# Patient Record
Sex: Male | Born: 1955
Health system: Southern US, Community
[De-identification: ages and names within clinical notes are randomized; demographics above are authoritative.]

## PROBLEM LIST (undated history)

## (undated) DIAGNOSIS — D369 Benign neoplasm, unspecified site: Secondary | ICD-10-CM

## (undated) DIAGNOSIS — A4902 Methicillin resistant Staphylococcus aureus infection, unspecified site: Secondary | ICD-10-CM

## (undated) DIAGNOSIS — H9191 Unspecified hearing loss, right ear: Secondary | ICD-10-CM

## (undated) DIAGNOSIS — I10 Essential (primary) hypertension: Secondary | ICD-10-CM

## (undated) DIAGNOSIS — H269 Unspecified cataract: Secondary | ICD-10-CM

## (undated) DIAGNOSIS — E119 Type 2 diabetes mellitus without complications: Secondary | ICD-10-CM

## (undated) DIAGNOSIS — I700 Atherosclerosis of aorta: Secondary | ICD-10-CM

## (undated) DIAGNOSIS — E785 Hyperlipidemia, unspecified: Secondary | ICD-10-CM

## (undated) HISTORY — PX: INCISE AND DRAIN ABCESS: PRO64

## (undated) HISTORY — DX: Hyperlipidemia, unspecified: E78.5

## (undated) HISTORY — DX: Essential (primary) hypertension: I10

## (undated) HISTORY — PX: OTHER SURGICAL HISTORY: SHX169

## (undated) HISTORY — PX: POLYPECTOMY: SHX149

## (undated) HISTORY — DX: Unspecified hearing loss, right ear: H91.91

## (undated) HISTORY — DX: Benign neoplasm, unspecified site: D36.9

## (undated) HISTORY — DX: Unspecified cataract: H26.9

## (undated) HISTORY — DX: Type 2 diabetes mellitus without complications: E11.9

## (undated) HISTORY — PX: CYST REMOVAL LEG: SHX6280

## (undated) HISTORY — PX: COLONOSCOPY: SHX174

## (undated) HISTORY — DX: Atherosclerosis of aorta: I70.0

## (undated) HISTORY — DX: Methicillin resistant Staphylococcus aureus infection, unspecified site: A49.02

## (undated) HISTORY — PX: DG THUMB LEFT HAND: HXRAD1658

---

## 2002-05-29 ENCOUNTER — Encounter: Admission: RE | Admit: 2002-05-29 | Discharge: 2002-08-27 | Payer: Self-pay | Admitting: Family Medicine

## 2002-09-20 ENCOUNTER — Encounter: Admission: RE | Admit: 2002-09-20 | Discharge: 2002-12-19 | Payer: Self-pay | Admitting: Family Medicine

## 2005-09-24 ENCOUNTER — Ambulatory Visit: Payer: Self-pay | Admitting: Gastroenterology

## 2005-10-07 ENCOUNTER — Ambulatory Visit: Payer: Self-pay | Admitting: Gastroenterology

## 2009-01-23 ENCOUNTER — Encounter: Admission: RE | Admit: 2009-01-23 | Discharge: 2009-01-23 | Payer: Self-pay | Admitting: Otolaryngology

## 2011-03-22 ENCOUNTER — Inpatient Hospital Stay (INDEPENDENT_AMBULATORY_CARE_PROVIDER_SITE_OTHER)
Admission: RE | Admit: 2011-03-22 | Discharge: 2011-03-22 | Disposition: A | Discharge status: Home / Self Care | Payer: BC Managed Care – PPO | Source: Ambulatory Visit | Attending: Family Medicine | Admitting: Family Medicine

## 2011-03-22 DIAGNOSIS — L02519 Cutaneous abscess of unspecified hand: Secondary | ICD-10-CM

## 2011-04-08 DIAGNOSIS — E1142 Type 2 diabetes mellitus with diabetic polyneuropathy: Secondary | ICD-10-CM | POA: Insufficient documentation

## 2015-10-17 ENCOUNTER — Encounter: Payer: Self-pay | Admitting: Gastroenterology

## 2015-10-29 ENCOUNTER — Encounter: Payer: Self-pay | Admitting: Gastroenterology

## 2015-12-02 ENCOUNTER — Ambulatory Visit (AMBULATORY_SURGERY_CENTER): Payer: Self-pay | Admitting: *Deleted

## 2015-12-02 VITALS — Ht 72.0 in | Wt 194.0 lb

## 2015-12-02 DIAGNOSIS — Z1211 Encounter for screening for malignant neoplasm of colon: Secondary | ICD-10-CM

## 2015-12-02 MED ORDER — NA SULFATE-K SULFATE-MG SULF 17.5-3.13-1.6 GM/177ML PO SOLN: ORAL | Status: DC

## 2015-12-02 NOTE — Progress Notes (Signed)
Patient denies any allergies to eggs or soy. Patient denies any problems with anesthesia/sedation. Patient denies any oxygen use at home and does not take any diet/weight loss medications. EMMI education assisgned to patient on colonoscopy, this was explained and instructions given to patient. 

## 2015-12-10 ENCOUNTER — Ambulatory Visit (AMBULATORY_SURGERY_CENTER): Payer: 59 | Admitting: Gastroenterology

## 2015-12-10 ENCOUNTER — Encounter: Payer: Self-pay | Admitting: Gastroenterology

## 2015-12-10 VITALS — BP 100/55 | HR 65 | Temp 97.8°F | Resp 18 | Ht 72.0 in | Wt 194.0 lb

## 2015-12-10 DIAGNOSIS — D122 Benign neoplasm of ascending colon: Secondary | ICD-10-CM

## 2015-12-10 DIAGNOSIS — Z1211 Encounter for screening for malignant neoplasm of colon: Secondary | ICD-10-CM | POA: Diagnosis not present

## 2015-12-10 MED ORDER — SODIUM CHLORIDE 0.9 % IV SOLN: 500.0000 mL | INTRAVENOUS | Status: DC

## 2015-12-10 NOTE — Progress Notes (Signed)
Called to room to assist during endoscopic procedure.  Patient ID and intended procedure confirmed with present staff. Received instructions for my participation in the procedure from the performing physician.  

## 2015-12-10 NOTE — Progress Notes (Signed)
A/ox3 pleased with MAC, report to Wendy RN 

## 2015-12-10 NOTE — Op Note (Signed)
[facility] Patient Name: Willie [name]: 12/10/2015 8:07 AM MRN: [id number] Endoscopist: Remo Lipps P. Havery Moros , MD Age: 60 [name]: 1956/03/05 Gender: Male Procedure:                Colonoscopy Indications:              Screening for malignant neoplasm in the colon Medicines:                Monitored Anesthesia Care Procedure:                Pre-Anesthesia Assessment:                           - Prior to the procedure, a History and Physical                            was performed, and patient medications and                            allergies were reviewed. The patient's tolerance of                            previous anesthesia was also reviewed. The risks                            and benefits of the procedure and the sedation                            options and risks were discussed with the patient.                            All questions were answered, and informed consent                            was obtained. Prior Anticoagulants: The patient has                            taken aspirin, last dose was 1 day prior to                            procedure. ASA Grade Assessment: II - A patient                            with mild systemic disease. After reviewing the                            risks and benefits, the patient was deemed in                            satisfactory condition to undergo the procedure.                           After obtaining informed consent, the colonoscope  was passed under direct vision. Throughout the                            procedure, the patient's blood pressure, pulse, and                            oxygen saturations were monitored continuously. The                            Model CF-HQ190L [id number]) scope was introduced                            through the anus and advanced to the the cecum,                            identified by appendiceal orifice and ileocecal                         valve. The colonoscopy was performed without                            difficulty. The patient tolerated the procedure                            well. The quality of the bowel preparation was                            adequate. The ileocecal valve, appendiceal orifice,                            and rectum were photographed. Scope In: 8:19:50 AM Scope Out: 8:36:04 AM Scope Withdrawal Time: 0 hours 13 minutes 32 seconds  Total Procedure Duration: 0 hours 16 minutes 14 seconds  Findings:                 The perianal and digital rectal examinations were                            normal.                           Two sessile polyps were found in the ascending                            colon. The polyps were 4 mm in size. These polyps                            were removed with a cold snare. Resection and                            retrieval were complete.                           A few medium-mouthed diverticula were found in the  left colon.                           The exam was otherwise without abnormality. Complications:            No immediate complications. Estimated blood loss:                            Minimal. Estimated Blood Loss:     Estimated blood loss was minimal. Impression:               - Two 4 mm polyps in the ascending colon, removed                            with a cold snare. Resected and retrieved.                           - Diverticulosis in the left colon.                           - The examination was otherwise normal. Recommendation:           - Patient has a contact number available for                            emergencies. The signs and symptoms of potential                            delayed complications were discussed with the                            patient. Return to normal activities tomorrow.                            Written discharge instructions were provided to the                             patient.                           - Resume previous diet.                           - Continue present medications.                           - No aspirin, ibuprofen, naproxen, or other                            non-steroidal anti-inflammatory drugs for 2 weeks                            after polyp removal.                           - Await pathology results.                           -  Repeat colonoscopy is recommended for                            surveillance. The colonoscopy [name] will be                            determined after pathology results from today's                            exam become available for review. Remo Lipps P. Armbruster, MD 12/10/2015 8:39:28 AM This report has been signed electronically.

## 2015-12-10 NOTE — Patient Instructions (Signed)
YOU HAD AN ENDOSCOPIC PROCEDURE TODAY AT THE [facility]:   Refer to the procedure report that was given to you for any specific questions about what was found during the examination.  If the procedure report does not answer your questions, please call your gastroenterologist to clarify.  If you requested that your care partner not be given the details of your procedure findings, then the procedure report has been included in a sealed envelope for you to review at your convenience later.  YOU SHOULD EXPECT: Some feelings of bloating in the abdomen. Passage of more gas than usual.  Walking can help get rid of the air that was put into your GI tract during the procedure and reduce the bloating. If you had a lower endoscopy (such as a colonoscopy or flexible sigmoidoscopy) you may notice spotting of blood in your stool or on the toilet paper. If you underwent a bowel prep for your procedure, you may not have a normal bowel movement for a few days.  Please Note:  You might notice some irritation and congestion in your nose or some drainage.  This is from the oxygen used during your procedure.  There is no need for concern and it should clear up in a day or so.  SYMPTOMS TO REPORT IMMEDIATELY:   Following lower endoscopy (colonoscopy or flexible sigmoidoscopy):  Excessive amounts of blood in the stool  Significant tenderness or worsening of abdominal pains  Swelling of the abdomen that is new, acute  Fever of 100F or higher   Following upper endoscopy (EGD)  Vomiting of blood or coffee ground material  New chest pain or pain under the shoulder blades  Painful or persistently difficult swallowing  New shortness of breath  Fever of 100F or higher  Black, tarry-looking stools  For urgent or emergent issues, a gastroenterologist can be reached at any hour by calling [phone].   DIET: Your first meal following the procedure should be a small meal and then it is ok to progress to  your normal diet. Heavy or fried foods are harder to digest and may make you feel nauseous or bloated.  Likewise, meals heavy in dairy and vegetables can increase bloating.  Drink plenty of fluids but you should avoid alcoholic beverages for 24 hours.  ACTIVITY:  You should plan to take it easy for the rest of today and you should NOT DRIVE or use heavy machinery until tomorrow (because of the sedation medicines used during the test).    FOLLOW UP: Our staff will call the number listed on your records the next business day following your procedure to check on you and address any questions or concerns that you may have regarding the information given to you following your procedure. If we do not reach you, we will leave a message.  However, if you are feeling well and you are not experiencing any problems, there is no need to return our call.  We will assume that you have returned to your regular daily activities without incident.  If any biopsies were taken you will be contacted by phone or by letter within the next 1-3 weeks.  Please call us at [phone] if you have not heard about the biopsies in 3 weeks.    SIGNATURES/CONFIDENTIALITY: You and/or your care partner have signed paperwork which will be entered into your electronic medical record.  These signatures attest to the fact that that the information above on your After Visit Summary has been reviewed   and is understood.  Full responsibility of the confidentiality of this discharge information lies with you and/or your care-partner.YOU HAD AN ENDOSCOPIC PROCEDURE TODAY AT [facility]:   Refer to the procedure report that was given to you for any specific questions about what was found during the examination.  If the procedure report does not answer your questions, please call your gastroenterologist to clarify.  If you requested that your care partner not be given the details of your procedure findings, then the procedure  report has been included in a sealed envelope for you to review at your convenience later.  YOU SHOULD EXPECT: Some feelings of bloating in the abdomen. Passage of more gas than usual.  Walking can help get rid of the air that was put into your GI tract during the procedure and reduce the bloating. If you had a lower endoscopy (such as a colonoscopy or flexible sigmoidoscopy) you may notice spotting of blood in your stool or on the toilet paper. If you underwent a bowel prep for your procedure, you may not have a normal bowel movement for a few days.  Please Note:  You might notice some irritation and congestion in your nose or some drainage.  This is from the oxygen used during your procedure.  There is no need for concern and it should clear up in a day or so.  SYMPTOMS TO REPORT IMMEDIATELY:   Following lower endoscopy (colonoscopy or flexible sigmoidoscopy):  Excessive amounts of blood in the stool  Significant tenderness or worsening of abdominal pains  Swelling of the abdomen that is new, acute  Fever of 100F or higher  For urgent or emergent issues, a gastroenterologist can be reached at any hour by calling [phone].   DIET: Your first meal following the procedure should be a small meal and then it is ok to progress to your normal diet. Heavy or fried foods are harder to digest and may make you feel nauseous or bloated.  Likewise, meals heavy in dairy and vegetables can increase bloating.  Drink plenty of fluids but you should avoid alcoholic beverages for 24 hours.  ACTIVITY:  You should plan to take it easy for the rest of today and you should NOT DRIVE or use heavy machinery until tomorrow (because of the sedation medicines used during the test).    FOLLOW UP: Our staff will call the number listed on your records the next business day following your procedure to check on you and address any questions or concerns that you may have regarding the information given to you  following your procedure. If we do not reach you, we will leave a message.  However, if you are feeling well and you are not experiencing any problems, there is no need to return our call.  We will assume that you have returned to your regular daily activities without incident.  If any biopsies were taken you will be contacted by phone or by letter within the next 1-3 weeks.  Please call us at [phone] if you have not heard about the biopsies in 3 weeks.    SIGNATURES/CONFIDENTIALITY: You and/or your care partner have signed paperwork which will be entered into your electronic medical record.  These signatures attest to the fact that that the information above on your After Visit Summary has been reviewed and is understood.  Full responsibility of the confidentiality of this discharge information lies with you and/or your care-partner.  Please review polyp, diverticulosis, and high  fiber diet handouts provided. Next colonoscopy determined by pathology results. No aspirin, ibuprofen, naproxen, or other non-steroidal anti-inflammatory drugs for 2 weeks.

## 2015-12-11 ENCOUNTER — Telehealth: Payer: Self-pay | Admitting: *Deleted

## 2015-12-11 NOTE — Telephone Encounter (Signed)
  Follow up Call-  Call back number 12/10/2015  Post procedure Call Back phone  # [phone]  Permission to leave phone message Yes     Patient questions:  Do you have a fever, pain , or abdominal swelling? No. Pain Score  0 *  Have you tolerated food without any problems? Yes.    Have you been able to return to your normal activities? Yes.    Do you have any questions about your discharge instructions: Diet   No. Medications  No. Follow up visit  No.  Do you have questions or concerns about your Care? No.  Actions: * If pain score is 4 or above: No action needed, pain <4.

## 2015-12-16 ENCOUNTER — Encounter: Payer: Self-pay | Admitting: Gastroenterology

## 2016-09-09 DIAGNOSIS — Z0000 Encounter for general adult medical examination without abnormal findings: Secondary | ICD-10-CM | POA: Diagnosis not present

## 2016-09-15 DIAGNOSIS — I10 Essential (primary) hypertension: Secondary | ICD-10-CM | POA: Diagnosis not present

## 2016-09-15 DIAGNOSIS — Z0000 Encounter for general adult medical examination without abnormal findings: Secondary | ICD-10-CM | POA: Diagnosis not present

## 2016-09-15 DIAGNOSIS — E1140 Type 2 diabetes mellitus with diabetic neuropathy, unspecified: Secondary | ICD-10-CM | POA: Diagnosis not present

## 2016-09-23 DIAGNOSIS — H9122 Sudden idiopathic hearing loss, left ear: Secondary | ICD-10-CM | POA: Diagnosis not present

## 2016-09-23 DIAGNOSIS — H9123 Sudden idiopathic hearing loss, bilateral: Secondary | ICD-10-CM | POA: Diagnosis not present

## 2016-09-23 DIAGNOSIS — H903 Sensorineural hearing loss, bilateral: Secondary | ICD-10-CM | POA: Diagnosis not present

## 2016-09-28 DIAGNOSIS — R05 Cough: Secondary | ICD-10-CM | POA: Diagnosis not present

## 2016-10-02 DIAGNOSIS — E1140 Type 2 diabetes mellitus with diabetic neuropathy, unspecified: Secondary | ICD-10-CM | POA: Diagnosis not present

## 2016-10-02 DIAGNOSIS — R05 Cough: Secondary | ICD-10-CM | POA: Diagnosis not present

## 2016-10-07 DIAGNOSIS — H9123 Sudden idiopathic hearing loss, bilateral: Secondary | ICD-10-CM | POA: Diagnosis not present

## 2016-10-21 DIAGNOSIS — L720 Epidermal cyst: Secondary | ICD-10-CM | POA: Diagnosis not present

## 2016-10-28 ENCOUNTER — Other Ambulatory Visit: Payer: Self-pay | Admitting: Otolaryngology

## 2016-10-28 DIAGNOSIS — H905 Unspecified sensorineural hearing loss: Secondary | ICD-10-CM

## 2016-11-09 ENCOUNTER — Ambulatory Visit
Admission: RE | Admit: 2016-11-09 | Discharge: 2016-11-09 | Disposition: A | Discharge status: Home / Self Care | Payer: 59 | Source: Ambulatory Visit | Attending: Otolaryngology | Admitting: Otolaryngology

## 2016-11-09 DIAGNOSIS — H905 Unspecified sensorineural hearing loss: Secondary | ICD-10-CM

## 2016-11-09 DIAGNOSIS — H9191 Unspecified hearing loss, right ear: Secondary | ICD-10-CM | POA: Diagnosis not present

## 2016-11-09 MED ORDER — GADOBENATE DIMEGLUMINE 529 MG/ML IV SOLN
17.0000 mL | Freq: Once | INTRAVENOUS | Status: AC | PRN
  Administered 2016-11-09: 17 mL via INTRAVENOUS

## 2017-02-12 DIAGNOSIS — R5383 Other fatigue: Secondary | ICD-10-CM | POA: Diagnosis not present

## 2017-02-12 DIAGNOSIS — I10 Essential (primary) hypertension: Secondary | ICD-10-CM | POA: Diagnosis not present

## 2017-02-12 DIAGNOSIS — E1140 Type 2 diabetes mellitus with diabetic neuropathy, unspecified: Secondary | ICD-10-CM | POA: Diagnosis not present

## 2017-02-22 DIAGNOSIS — L03031 Cellulitis of right toe: Secondary | ICD-10-CM | POA: Diagnosis not present

## 2017-02-24 ENCOUNTER — Encounter: Payer: Self-pay | Admitting: Podiatry

## 2017-02-24 ENCOUNTER — Ambulatory Visit (INDEPENDENT_AMBULATORY_CARE_PROVIDER_SITE_OTHER): Payer: 59 | Admitting: Podiatry

## 2017-02-24 VITALS — BP 129/70 | HR 82 | Temp 98.1°F | Resp 18

## 2017-02-24 DIAGNOSIS — L02612 Cutaneous abscess of left foot: Secondary | ICD-10-CM

## 2017-02-24 DIAGNOSIS — L03032 Cellulitis of left toe: Secondary | ICD-10-CM | POA: Diagnosis not present

## 2017-02-24 NOTE — Patient Instructions (Signed)
Continue taking her doxycycline one twice a day as prescribed If you notice increase swelling, pain, redness extending beyond the knuckle on the left great toe present to emergency department   Betadine Soak Instructions  Purchase an 8 oz. bottle of BETADINE solution (Povidone)  THE DAY AFTER THE PROCEDURE  Place 1 tablespoon of betadine solution in a quart of warm tap water.  Submerge your foot or feet with outer bandage intact for the initial soak; this will allow the bandage to become moist and wet for easy lift off.  Once you remove your bandage, continue to soak in the solution for 2-3 minutes.  This soak should be done 1-2 a day.  Next, remove your foot or feet from solution, blot dry the affected area and cover.  You may use a band aid large enough to cover the area or use gauze and tape.  Apply other medications to the area as directed by the doctor such as triple antibiotic ointment  IF YOUR SKIN BECOMES IRRITATED WHILE USING THESE INSTRUCTIONS, IT IS OKAY TO SWITCH TO EPSOM SALTS AND WATER OR WHITE VINEGAR AND WATER.

## 2017-02-24 NOTE — Progress Notes (Signed)
   Subjective:    Patient ID: Willie [name], male    DOB: [birth year], 61 y.o.   MRN: [id number]  HPI This patient presents today with his wife present in the treatment room complaining of approximately 5 day history of  swelling, redness in the right hallux toenail area. The swelling and redness have increased slightly over the past 5 days. Patient states that he has visited PA who prescribed doxycycline and has had 2 doses without any complaint from medication. Patient said no prior history of this problem  Patient is diabetic 20 years Denies history of ulceration, claudication or amputation Discontinue smoking 40+ years ago  Patient is a Librarian, academic requiring constant standing and walking    Review of Systems  Endocrine:       Increase urination  Genitourinary: Positive for frequency and urgency.  All other systems reviewed and are negative.      Objective:   Physical Exam   BP 129/70 Pulse 82 Respiration 18 Temperature 98.1 Fahrenheit  Orientated 3  Vascular: DP and PT pulses 2/4 bilaterally Capillary reflex within normal limits bilaterally  Neurological: Sensation to 10 g monofilament wire intact 9/9 bilaterally Vibratory sensation reactive bilaterally Ankle reflex equal and reactive bilaterally  Dermatological: No open skin lesions bilaterally Localized erythema, edema posterior nail fold area right hallux. There is fluctuance to palpation The right hallux nail plate is lifting off the nailbed surrounding white encapsulated drainage. There is no warmth or malodor odor noted in the right hallux nail area  Musculoskeletal: There is no restriction ankle, subtalar, midtarsal joints bilaterally Manual motor testing dorsi flexion, plantar flexion, inversion, eversion 5/5 bilaterally         Assessment & Plan:   Assessment: Diabetic with satisfactory neurovascular status Paronychia/abscess right hallux  Plan: I reviewed the results exam with patient today  and recommended I&D. Patient verbally consents The right hallux was blocked with 3 mL of 50-50 mixture of 2% plain Xylocaine and 0.5% plain Sensorcaine. The toe is prepped with Betadine and exsanguinated. The left hallux nail was a avulsed releasing a white, creamy drainage that was submitted for culture and sensitivity. The underlying nailbed a bleeding granular appearance. No malodor was noted. An antibiotic compression dressing was applied. The tourniquet was released and spontaneous capillary fill time noted in the right hallux. Patient tolerated procedure without any difficulty. Betadine soaks prescribed. Patient instructed continue taking doxycycline twice a day as prescribed until gone Patient instructed to observe right foot/ toe area for any increase in pain, swelling, redness, fever. Instructed that the redness extends beyond the interphalangeal joint of the right hallux to present to the emergency department. Results of culture and sensitivity pending  Reappoint if patient at patient's request

## 2017-02-27 LAB — CULTURE, ROUTINE-ABSCESS
Gram Stain: NONE SEEN
Gram Stain: NONE SEEN
ORGANISM ID, BACTERIA: NORMAL

## 2017-03-15 DIAGNOSIS — E113292 Type 2 diabetes mellitus with mild nonproliferative diabetic retinopathy without macular edema, left eye: Secondary | ICD-10-CM | POA: Diagnosis not present

## 2017-03-15 DIAGNOSIS — H2513 Age-related nuclear cataract, bilateral: Secondary | ICD-10-CM | POA: Diagnosis not present

## 2017-03-15 DIAGNOSIS — H5203 Hypermetropia, bilateral: Secondary | ICD-10-CM | POA: Diagnosis not present

## 2017-03-25 DIAGNOSIS — I10 Essential (primary) hypertension: Secondary | ICD-10-CM | POA: Diagnosis not present

## 2017-03-25 DIAGNOSIS — E1140 Type 2 diabetes mellitus with diabetic neuropathy, unspecified: Secondary | ICD-10-CM | POA: Diagnosis not present

## 2017-05-04 DIAGNOSIS — I10 Essential (primary) hypertension: Secondary | ICD-10-CM | POA: Diagnosis not present

## 2017-05-04 DIAGNOSIS — E1140 Type 2 diabetes mellitus with diabetic neuropathy, unspecified: Secondary | ICD-10-CM | POA: Diagnosis not present

## 2017-05-25 DIAGNOSIS — S60450A Superficial foreign body of right index finger, initial encounter: Secondary | ICD-10-CM | POA: Diagnosis not present

## 2017-06-08 DIAGNOSIS — S61241A Puncture wound with foreign body of left index finger without damage to nail, initial encounter: Secondary | ICD-10-CM | POA: Diagnosis not present

## 2017-07-23 DIAGNOSIS — I10 Essential (primary) hypertension: Secondary | ICD-10-CM | POA: Diagnosis not present

## 2017-07-23 DIAGNOSIS — E1140 Type 2 diabetes mellitus with diabetic neuropathy, unspecified: Secondary | ICD-10-CM | POA: Diagnosis not present

## 2017-07-23 DIAGNOSIS — E782 Mixed hyperlipidemia: Secondary | ICD-10-CM | POA: Diagnosis not present

## 2017-08-25 DIAGNOSIS — I10 Essential (primary) hypertension: Secondary | ICD-10-CM | POA: Diagnosis not present

## 2017-08-25 DIAGNOSIS — Z9119 Patient's noncompliance with other medical treatment and regimen: Secondary | ICD-10-CM | POA: Diagnosis not present

## 2017-08-25 DIAGNOSIS — Z794 Long term (current) use of insulin: Secondary | ICD-10-CM | POA: Insufficient documentation

## 2017-08-25 DIAGNOSIS — E1140 Type 2 diabetes mellitus with diabetic neuropathy, unspecified: Secondary | ICD-10-CM | POA: Diagnosis not present

## 2017-11-08 DIAGNOSIS — R82998 Other abnormal findings in urine: Secondary | ICD-10-CM | POA: Diagnosis not present

## 2017-11-08 DIAGNOSIS — Z0000 Encounter for general adult medical examination without abnormal findings: Secondary | ICD-10-CM | POA: Diagnosis not present

## 2017-11-15 DIAGNOSIS — Z125 Encounter for screening for malignant neoplasm of prostate: Secondary | ICD-10-CM | POA: Diagnosis not present

## 2017-11-15 DIAGNOSIS — E1140 Type 2 diabetes mellitus with diabetic neuropathy, unspecified: Secondary | ICD-10-CM | POA: Diagnosis not present

## 2017-11-15 DIAGNOSIS — I10 Essential (primary) hypertension: Secondary | ICD-10-CM | POA: Diagnosis not present

## 2017-11-15 DIAGNOSIS — E1139 Type 2 diabetes mellitus with other diabetic ophthalmic complication: Secondary | ICD-10-CM | POA: Diagnosis not present

## 2017-11-15 DIAGNOSIS — Z23 Encounter for immunization: Secondary | ICD-10-CM | POA: Diagnosis not present

## 2017-11-15 DIAGNOSIS — Z0000 Encounter for general adult medical examination without abnormal findings: Secondary | ICD-10-CM | POA: Diagnosis not present

## 2017-11-15 DIAGNOSIS — Z1389 Encounter for screening for other disorder: Secondary | ICD-10-CM | POA: Diagnosis not present

## 2017-11-30 DIAGNOSIS — Z1212 Encounter for screening for malignant neoplasm of rectum: Secondary | ICD-10-CM | POA: Diagnosis not present

## 2018-03-16 DIAGNOSIS — H2513 Age-related nuclear cataract, bilateral: Secondary | ICD-10-CM | POA: Diagnosis not present

## 2018-03-16 DIAGNOSIS — E113293 Type 2 diabetes mellitus with mild nonproliferative diabetic retinopathy without macular edema, bilateral: Secondary | ICD-10-CM | POA: Diagnosis not present

## 2018-03-17 DIAGNOSIS — E1140 Type 2 diabetes mellitus with diabetic neuropathy, unspecified: Secondary | ICD-10-CM | POA: Diagnosis not present

## 2018-03-17 DIAGNOSIS — E1142 Type 2 diabetes mellitus with diabetic polyneuropathy: Secondary | ICD-10-CM | POA: Diagnosis not present

## 2018-03-17 DIAGNOSIS — E1139 Type 2 diabetes mellitus with other diabetic ophthalmic complication: Secondary | ICD-10-CM | POA: Diagnosis not present

## 2018-05-10 DIAGNOSIS — H6993 Unspecified Eustachian tube disorder, bilateral: Secondary | ICD-10-CM | POA: Diagnosis not present

## 2018-05-10 DIAGNOSIS — H903 Sensorineural hearing loss, bilateral: Secondary | ICD-10-CM | POA: Diagnosis not present

## 2018-05-18 DIAGNOSIS — H903 Sensorineural hearing loss, bilateral: Secondary | ICD-10-CM | POA: Diagnosis not present

## 2018-09-08 DIAGNOSIS — Z23 Encounter for immunization: Secondary | ICD-10-CM | POA: Diagnosis not present

## 2018-09-08 DIAGNOSIS — R591 Generalized enlarged lymph nodes: Secondary | ICD-10-CM | POA: Diagnosis not present

## 2018-09-30 ENCOUNTER — Other Ambulatory Visit: Payer: Self-pay | Admitting: Internal Medicine

## 2018-09-30 ENCOUNTER — Ambulatory Visit
Admission: RE | Admit: 2018-09-30 | Discharge: 2018-09-30 | Disposition: A | Discharge status: Home / Self Care | Payer: 59 | Source: Ambulatory Visit | Attending: Internal Medicine | Admitting: Internal Medicine

## 2018-09-30 DIAGNOSIS — R591 Generalized enlarged lymph nodes: Secondary | ICD-10-CM

## 2018-09-30 DIAGNOSIS — R599 Enlarged lymph nodes, unspecified: Secondary | ICD-10-CM | POA: Diagnosis not present

## 2018-09-30 DIAGNOSIS — Z6827 Body mass index (BMI) 27.0-27.9, adult: Secondary | ICD-10-CM | POA: Diagnosis not present

## 2018-09-30 MED ORDER — IOPAMIDOL (ISOVUE-300) INJECTION 61%
75.0000 mL | Freq: Once | INTRAVENOUS | Status: AC | PRN
  Administered 2018-09-30: 75 mL via INTRAVENOUS

## 2018-10-10 DIAGNOSIS — Z87891 Personal history of nicotine dependence: Secondary | ICD-10-CM | POA: Diagnosis not present

## 2018-10-10 DIAGNOSIS — R591 Generalized enlarged lymph nodes: Secondary | ICD-10-CM | POA: Diagnosis not present

## 2018-10-12 DIAGNOSIS — R591 Generalized enlarged lymph nodes: Secondary | ICD-10-CM | POA: Diagnosis not present

## 2018-10-13 DIAGNOSIS — D481 Neoplasm of uncertain behavior of connective and other soft tissue: Secondary | ICD-10-CM | POA: Diagnosis not present

## 2018-10-13 DIAGNOSIS — R599 Enlarged lymph nodes, unspecified: Secondary | ICD-10-CM | POA: Diagnosis not present

## 2018-11-21 DIAGNOSIS — E782 Mixed hyperlipidemia: Secondary | ICD-10-CM | POA: Diagnosis not present

## 2018-11-21 DIAGNOSIS — E1140 Type 2 diabetes mellitus with diabetic neuropathy, unspecified: Secondary | ICD-10-CM | POA: Diagnosis not present

## 2018-11-21 DIAGNOSIS — I10 Essential (primary) hypertension: Secondary | ICD-10-CM | POA: Diagnosis not present

## 2018-11-21 DIAGNOSIS — Z125 Encounter for screening for malignant neoplasm of prostate: Secondary | ICD-10-CM | POA: Diagnosis not present

## 2018-11-24 DIAGNOSIS — I10 Essential (primary) hypertension: Secondary | ICD-10-CM | POA: Diagnosis not present

## 2018-11-25 DIAGNOSIS — E782 Mixed hyperlipidemia: Secondary | ICD-10-CM | POA: Diagnosis not present

## 2018-11-25 DIAGNOSIS — E11319 Type 2 diabetes mellitus with unspecified diabetic retinopathy without macular edema: Secondary | ICD-10-CM | POA: Diagnosis not present

## 2018-11-25 DIAGNOSIS — E1142 Type 2 diabetes mellitus with diabetic polyneuropathy: Secondary | ICD-10-CM | POA: Diagnosis not present

## 2018-11-25 DIAGNOSIS — E1139 Type 2 diabetes mellitus with other diabetic ophthalmic complication: Secondary | ICD-10-CM | POA: Diagnosis not present

## 2018-11-25 DIAGNOSIS — Z0000 Encounter for general adult medical examination without abnormal findings: Secondary | ICD-10-CM | POA: Diagnosis not present

## 2018-11-25 DIAGNOSIS — R591 Generalized enlarged lymph nodes: Secondary | ICD-10-CM | POA: Diagnosis not present

## 2018-11-25 DIAGNOSIS — Z86010 Personal history of colonic polyps: Secondary | ICD-10-CM | POA: Diagnosis not present

## 2018-11-25 DIAGNOSIS — I10 Essential (primary) hypertension: Secondary | ICD-10-CM | POA: Diagnosis not present

## 2018-11-25 DIAGNOSIS — E1140 Type 2 diabetes mellitus with diabetic neuropathy, unspecified: Secondary | ICD-10-CM | POA: Diagnosis not present

## 2018-11-28 ENCOUNTER — Other Ambulatory Visit: Payer: Self-pay | Admitting: Internal Medicine

## 2018-11-28 DIAGNOSIS — Z0000 Encounter for general adult medical examination without abnormal findings: Secondary | ICD-10-CM

## 2019-01-09 ENCOUNTER — Ambulatory Visit: Payer: 59

## 2019-01-16 ENCOUNTER — Ambulatory Visit
Admission: RE | Admit: 2019-01-16 | Discharge: 2019-01-16 | Disposition: A | Discharge status: Home / Self Care | Payer: PPO | Source: Ambulatory Visit | Attending: Internal Medicine | Admitting: Internal Medicine

## 2019-01-16 DIAGNOSIS — Z136 Encounter for screening for cardiovascular disorders: Secondary | ICD-10-CM | POA: Diagnosis not present

## 2019-01-16 DIAGNOSIS — Z0000 Encounter for general adult medical examination without abnormal findings: Secondary | ICD-10-CM

## 2019-01-16 DIAGNOSIS — Z87891 Personal history of nicotine dependence: Secondary | ICD-10-CM | POA: Diagnosis not present

## 2019-03-30 DIAGNOSIS — E1139 Type 2 diabetes mellitus with other diabetic ophthalmic complication: Secondary | ICD-10-CM | POA: Diagnosis not present

## 2019-03-30 DIAGNOSIS — E782 Mixed hyperlipidemia: Secondary | ICD-10-CM | POA: Diagnosis not present

## 2019-03-30 DIAGNOSIS — I10 Essential (primary) hypertension: Secondary | ICD-10-CM | POA: Diagnosis not present

## 2019-03-30 DIAGNOSIS — Z794 Long term (current) use of insulin: Secondary | ICD-10-CM | POA: Diagnosis not present

## 2019-03-30 DIAGNOSIS — E1140 Type 2 diabetes mellitus with diabetic neuropathy, unspecified: Secondary | ICD-10-CM | POA: Diagnosis not present

## 2019-03-30 DIAGNOSIS — I700 Atherosclerosis of aorta: Secondary | ICD-10-CM | POA: Diagnosis not present

## 2019-03-30 DIAGNOSIS — M79673 Pain in unspecified foot: Secondary | ICD-10-CM | POA: Diagnosis not present

## 2019-05-04 DIAGNOSIS — T161XXA Foreign body in right ear, initial encounter: Secondary | ICD-10-CM | POA: Diagnosis not present

## 2019-07-28 DIAGNOSIS — E11319 Type 2 diabetes mellitus with unspecified diabetic retinopathy without macular edema: Secondary | ICD-10-CM | POA: Diagnosis not present

## 2019-07-28 DIAGNOSIS — E1140 Type 2 diabetes mellitus with diabetic neuropathy, unspecified: Secondary | ICD-10-CM | POA: Diagnosis not present

## 2019-07-28 DIAGNOSIS — I10 Essential (primary) hypertension: Secondary | ICD-10-CM | POA: Diagnosis not present

## 2019-07-28 DIAGNOSIS — E1142 Type 2 diabetes mellitus with diabetic polyneuropathy: Secondary | ICD-10-CM | POA: Diagnosis not present

## 2019-07-28 DIAGNOSIS — I700 Atherosclerosis of aorta: Secondary | ICD-10-CM | POA: Diagnosis not present

## 2019-07-28 DIAGNOSIS — E1139 Type 2 diabetes mellitus with other diabetic ophthalmic complication: Secondary | ICD-10-CM | POA: Diagnosis not present

## 2019-07-28 DIAGNOSIS — E782 Mixed hyperlipidemia: Secondary | ICD-10-CM | POA: Diagnosis not present

## 2019-07-28 DIAGNOSIS — Z794 Long term (current) use of insulin: Secondary | ICD-10-CM | POA: Diagnosis not present

## 2019-08-16 DIAGNOSIS — D1801 Hemangioma of skin and subcutaneous tissue: Secondary | ICD-10-CM | POA: Diagnosis not present

## 2019-08-16 DIAGNOSIS — L603 Nail dystrophy: Secondary | ICD-10-CM | POA: Diagnosis not present

## 2019-08-16 DIAGNOSIS — L570 Actinic keratosis: Secondary | ICD-10-CM | POA: Diagnosis not present

## 2019-08-16 DIAGNOSIS — L918 Other hypertrophic disorders of the skin: Secondary | ICD-10-CM | POA: Diagnosis not present

## 2019-08-16 DIAGNOSIS — L821 Other seborrheic keratosis: Secondary | ICD-10-CM | POA: Diagnosis not present

## 2019-08-16 DIAGNOSIS — L814 Other melanin hyperpigmentation: Secondary | ICD-10-CM | POA: Diagnosis not present

## 2019-08-16 DIAGNOSIS — D485 Neoplasm of uncertain behavior of skin: Secondary | ICD-10-CM | POA: Diagnosis not present

## 2019-10-23 ENCOUNTER — Other Ambulatory Visit: Payer: Self-pay | Admitting: *Deleted

## 2019-10-24 NOTE — Patient Outreach (Addendum)
  [facility]) Care Management Chronic Special Needs Program    10/24/2019  Name: Willie [name], DOB: 1956-05-09  MRN: [id number]   Mr. Willie [name] is enrolled in a chronic special needs plan for Diabetes.  A completed health risk assessment has  been received from the client and client.   The client's individualized care plan was developed based on HRA and available data.  Plan:   Send unsuccessful outreach letter with a copy of individualized care plan to client  Send Advance Directive Packet to client   Send individualized care plan to provider    Chronic care management coordinator, Jacqlyn Larsen [phone]),  will attempt outreach in 2-4 months.  Goals    . Client understands the importance of follow-up with providers by attending scheduled visits     Last follow up with provider 07/28/2019 Keep all follow up appointments with your provider Write down questions you want to get answered for your provider prior to your visit.     . Client will use Assistive Devices as needed and verbalize understanding of device use     Review your insurance plan to determine your benefits for DME Education - [facility] - "Durable Medical Equipment"     . HEMOGLOBIN A1C < 7.0     Education given - "Diabetes Checklist" Follow up with your provider to determine your target A1C Education - [facility] -"Hemoglobin A1C Tests" Importance of A1C q 3-6 months or as recommended by your provider     . Maintain timely refills of diabetic medication as prescribed within the year .     Education - [facility] ' "Why Taking Your Medicine or Drug as Ordered Is Important"    . Obtain annual  Lipid Profile, LDL-C     Review of records indicate last Lipid Profile on 11/21/2018. Please keep your yearly physicals and follow up visits with your providers and complete labs as recommended.  Education - Diabetes checklist    . Obtain Annual Eye (retinal)  Exam      Last eye exam 06/23/2019 Please attend  scheduled yearly follow up visits with your provider for eye exams.      . Obtain Annual Foot Exam     Please attend yearly physicals and follow up visits with your providers and have .     [name] Obtain annual screen for micro albuminuria (urine) , nephropathy (kidney problems)     Review of records indicates Albumin 4.2 on 11/21/18. Attend yearly physical appointments and follow up visits with your providers and complete labs as recommended.  It is important for your doctor to check your urine for protein at least every year    . Obtain Hemoglobin A1C at least 2 times per year     Review of medical record indicates last A1C 8.1 on 11/21/2018 Continue to keep scheduled appointments with provider to complete lab work.     . Visit Primary Care Provider or Endocrinologist at least 2 times per year      Review of medical record indicates client has completed 3 office visits with primary care provider in 2020. Continue to keep scheduled follow up appointments with provider.         Jerilynn Birkenhead, RN, MSN, CCM, [facility] Clinical Nurse Specialist Office Number [phone]

## 2019-11-24 DIAGNOSIS — E1139 Type 2 diabetes mellitus with other diabetic ophthalmic complication: Secondary | ICD-10-CM | POA: Diagnosis not present

## 2019-11-24 DIAGNOSIS — Z125 Encounter for screening for malignant neoplasm of prostate: Secondary | ICD-10-CM | POA: Diagnosis not present

## 2019-11-24 DIAGNOSIS — E782 Mixed hyperlipidemia: Secondary | ICD-10-CM | POA: Diagnosis not present

## 2019-12-08 DIAGNOSIS — H9319 Tinnitus, unspecified ear: Secondary | ICD-10-CM | POA: Insufficient documentation

## 2019-12-08 DIAGNOSIS — I700 Atherosclerosis of aorta: Secondary | ICD-10-CM | POA: Diagnosis not present

## 2019-12-08 DIAGNOSIS — E1140 Type 2 diabetes mellitus with diabetic neuropathy, unspecified: Secondary | ICD-10-CM | POA: Diagnosis not present

## 2019-12-08 DIAGNOSIS — Z86010 Personal history of colonic polyps: Secondary | ICD-10-CM | POA: Diagnosis not present

## 2019-12-08 DIAGNOSIS — R1310 Dysphagia, unspecified: Secondary | ICD-10-CM | POA: Diagnosis not present

## 2019-12-08 DIAGNOSIS — R82998 Other abnormal findings in urine: Secondary | ICD-10-CM | POA: Diagnosis not present

## 2019-12-08 DIAGNOSIS — I10 Essential (primary) hypertension: Secondary | ICD-10-CM | POA: Diagnosis not present

## 2019-12-08 DIAGNOSIS — E1142 Type 2 diabetes mellitus with diabetic polyneuropathy: Secondary | ICD-10-CM | POA: Diagnosis not present

## 2019-12-08 DIAGNOSIS — E1139 Type 2 diabetes mellitus with other diabetic ophthalmic complication: Secondary | ICD-10-CM | POA: Diagnosis not present

## 2019-12-08 DIAGNOSIS — Z794 Long term (current) use of insulin: Secondary | ICD-10-CM | POA: Diagnosis not present

## 2019-12-08 DIAGNOSIS — H93A2 Pulsatile tinnitus, left ear: Secondary | ICD-10-CM | POA: Diagnosis not present

## 2019-12-08 DIAGNOSIS — E782 Mixed hyperlipidemia: Secondary | ICD-10-CM | POA: Diagnosis not present

## 2019-12-08 DIAGNOSIS — Z23 Encounter for immunization: Secondary | ICD-10-CM | POA: Diagnosis not present

## 2019-12-08 DIAGNOSIS — Z0000 Encounter for general adult medical examination without abnormal findings: Secondary | ICD-10-CM | POA: Diagnosis not present

## 2019-12-14 ENCOUNTER — Encounter: Payer: Self-pay | Admitting: *Deleted

## 2019-12-14 ENCOUNTER — Other Ambulatory Visit: Payer: Self-pay | Admitting: *Deleted

## 2019-12-14 DIAGNOSIS — K921 Melena: Secondary | ICD-10-CM | POA: Diagnosis not present

## 2019-12-14 NOTE — Patient Outreach (Signed)
[place] [facility]) Care Management Chronic Special Needs Program  12/14/2019  Name: Shiva Eisenstadt DOB: Jan 01, 1956  MRN: [id number]  Mr. Rosevelt Flatley is enrolled in a chronic special needs plan for Diabetes. Chronic Care Management Coordinator telephoned client to review health risk assessment and to develop individualized care plan.  Introduced the chronic care management program, importance of client participation, and taking their care plan to all provider appointments and inpatient facilities.  Reviewed the transition of care process and possible referral to community care management.  Subjective: Spoke with client who reports he lives with his spouse and is independent in all aspects of his care.  Reports checks CBG once daily with ranges 86-136 and CBG has improved since being on ozempic. Client states he saw primary care provider last week for a physical.  Client does not record CBG, keeps readings stored in glucometer. Client feels blood sugar is much improved since being on ozempic. Client states he does not really watch his diet all that closely. Client states he had a lump in his neck area and is under the care of otolaryngology for this and "area is being watched"  Client reports he did receive Advanced Directive packet in the mail but has not completed yet.  Client states he does not have 24 hour nurse line magnet, does have HTA calendar. RN care manager provided 24 hour nurse line number and will mail a magnet.   Goals Addressed            This Visit's Progress   . COMPLETED:  Acknowledge receipt of Advanced Directive package       Client reports he did receive Advanced Directives packet and will contact RN care manager for any questions as needed.    . "continue keeping AIC normal range" (pt-stated)       Continue following up with your doctor Try the plate method per our discussion Call HTA conciegre at [phone] for any benefit related questions Please use the  24 hour nurse advice line at [phone] as needed for health related issues/ concerns     . Client understands the importance of follow-up with providers by attending scheduled visits   On track    Review of medical record indicates client has completed  3 office visits with primary care provider in 2020 and 1 visit in 2021 Continue to follow up with your health care provider as needed     . COMPLETED: Client will use Assistive Devices as needed and verbalize understanding of device use       Client reports does not have or use assistive devices     . HEMOGLOBIN A1C < 7.0       Per medical record review Hgb AIC completed on 12/05/19  7.0 Diabetes self management actions as follows- Continue to keep your follow up appointments with your provider and have lab work completed as recommended. Monitor glucose (blood sugar) per health care provider recommendation. Check feet daily Visit health care provider every 3-6 months as directed. It is important to have your Hgb AIC checked every 3-6 months (every 6 months if you are at goal and every 3 months if you are not at goal). Eye exam yearly Carbohydrate controlled meal planning Take diabetes medications as prescribed by health care provider Physical activity RN care manager mailed EMMI education article " Diabetes care check list" and " Diabetes and diet"      . Maintain timely refills of diabetic medication as prescribed within the year .  On track    Take medications as prescribed. Follow up with your health care provider if you have any questions. Please contact your assigned RN care manager if you have difficulty obtaining medications. Review of electronic medical record medication dispense report indicates client maintains timely refills of diabetic medications.     . Obtain annual  Lipid Profile, LDL-C       Per medical record review, Lipid profile completed on 11/21/18 LDL= 108 The goal for LDL is less than 70mg /dl as you are  at high risk for complications. Try to avoid saturated fats, trans-fats and eat more fiber. Continue to follow up with your health care provider for any needed lab work.     . Obtain Annual Eye (retinal)  Exam    On track    Per medical record review, client had eye exam on 06/23/19 Plan to have dilated eye exam every year Plan to schedule your eye exam yearly      . Obtain Annual Foot Exam   On track    Client states provider checks feet 2 times per year. Per medical record review, foot exam completed on 12/05/19 Your doctor should check your feet at least once a year. Plan to schedule a foot exam with your health care provider once every year. Check your skin and feet daily for cuts, bruises, redness, blisters or sore.     . Obtain annual screen for micro albuminuria (urine) , nephropathy (kidney problems)   On track    Per medical record review, microalbuminuria completed on 11/24/18 Continue to obtain yearly physicals and lab checks as recommended by your health care provider. It is important for your health care provider to check your urine for protein at least once every year.     Illa Level Hemoglobin A1C at least 2 times per year       Review of medical record indicates A1C 8.1 on 11/21/2018 and 7.0 12/05/19 Continue to follow up with your health care provider for any needed/ recommended lab work      . Visit Primary Care Provider or Endocrinologist at least 2 times per year    On track    Review of medical record indicates client has completed 3 office visits with primary care provider in 2020 and 1 visit in 2021. Continue to keep scheduled follow up appointments with provider.         PLAN: RN care manager faxed today's note with updated individualized care plan to primary care provider, mailed updated individualized care plan to client, along with education materials, consent form and magnet.  Chronic care management coordination will outreach in:  9-12 months  Kassie Mends Nursing/RN [place] Case Manager, C-SNP  [phone]

## 2019-12-18 NOTE — Patient Outreach (Addendum)
Received referral for Pharmacy assistance from Dr. Brigitte Pulse.  Patient has [employer], I have sent them the referral through their portal.    Ticket saved successfully with Tracking ID: [id number]

## 2019-12-22 DIAGNOSIS — K921 Melena: Secondary | ICD-10-CM | POA: Diagnosis not present

## 2019-12-26 ENCOUNTER — Encounter: Payer: Self-pay | Admitting: Physician Assistant

## 2020-01-19 DIAGNOSIS — E1149 Type 2 diabetes mellitus with other diabetic neurological complication: Secondary | ICD-10-CM | POA: Insufficient documentation

## 2020-01-19 DIAGNOSIS — R1310 Dysphagia, unspecified: Secondary | ICD-10-CM | POA: Insufficient documentation

## 2020-01-19 DIAGNOSIS — I700 Atherosclerosis of aorta: Secondary | ICD-10-CM | POA: Insufficient documentation

## 2020-01-19 DIAGNOSIS — K635 Polyp of colon: Secondary | ICD-10-CM | POA: Insufficient documentation

## 2020-01-19 DIAGNOSIS — E11319 Type 2 diabetes mellitus with unspecified diabetic retinopathy without macular edema: Secondary | ICD-10-CM

## 2020-01-19 DIAGNOSIS — I10 Essential (primary) hypertension: Secondary | ICD-10-CM | POA: Insufficient documentation

## 2020-01-19 DIAGNOSIS — E782 Mixed hyperlipidemia: Secondary | ICD-10-CM

## 2020-01-19 DIAGNOSIS — E785 Hyperlipidemia, unspecified: Secondary | ICD-10-CM | POA: Insufficient documentation

## 2020-01-19 DIAGNOSIS — E1140 Type 2 diabetes mellitus with diabetic neuropathy, unspecified: Secondary | ICD-10-CM | POA: Insufficient documentation

## 2020-01-19 DIAGNOSIS — E1139 Type 2 diabetes mellitus with other diabetic ophthalmic complication: Secondary | ICD-10-CM | POA: Insufficient documentation

## 2020-01-22 ENCOUNTER — Ambulatory Visit: Payer: HMO | Admitting: Physician Assistant

## 2020-01-22 ENCOUNTER — Encounter: Payer: Self-pay | Admitting: Physician Assistant

## 2020-01-22 VITALS — BP 118/70 | HR 71 | Ht 71.0 in | Wt 188.2 lb

## 2020-01-22 DIAGNOSIS — R195 Other fecal abnormalities: Secondary | ICD-10-CM | POA: Diagnosis not present

## 2020-01-22 DIAGNOSIS — Z860101 Personal history of adenomatous and serrated colon polyps: Secondary | ICD-10-CM

## 2020-01-22 DIAGNOSIS — Z86010 Personal history of colonic polyps: Secondary | ICD-10-CM | POA: Diagnosis not present

## 2020-01-22 MED ORDER — NA SULFATE-K SULFATE-MG SULF 17.5-3.13-1.6 GM/177ML PO SOLN: 1.0000 | Freq: Once | ORAL | 0 refills | Status: AC

## 2020-01-22 NOTE — Patient Instructions (Addendum)
If you are age 66 or older, your body mass index should be between 23-30. Your Body mass index is 26.26 kg/m. If this is out of the aforementioned range listed, please consider follow up with your Primary Care Provider.  If you are age 62 or younger, your body mass index should be between 19-25. Your Body mass index is 26.26 kg/m. If this is out of the aformentioned range listed, please consider follow up with your Primary Care Provider.   You have been scheduled for an endoscopy and colonoscopy. Please follow the written instructions given to you at your visit today. Please pick up your prep supplies at the pharmacy within the next 1-3 days. If you use inhalers (even only as needed), please bring them with you on the day of your procedure.  Follow up pending the results of your Colonoscopy/Endoscopy

## 2020-01-22 NOTE — Progress Notes (Signed)
Subjective:    Patient ID: Willie [name], male    DOB: 05/11/1956, 64 y.o.   MRN: [id number]  HPI  Willie [name] is a pleasant 64 year old male, established with Dr. Havery Moros who is referred back today by Dr. Brigitte Pulse with finding of Hemoccult positive stool. He was last seen in 2017 here when he underwent screening colonoscopy which showed a few diverticuli in the left colon and two 4 mm polyps were removed which were tubular adenomas. . Patient had undergone a recent physical exam with unremarkable labs but was noted to have a positive Hemoccult, this was followed up with a hematuria which was also positive. Patient himself has not noted any melena or hematochezia.  He has noted that his stools have been pasty or at times over the past few months and he is also noticed some postprandial urgency.  He did start Ozempic about 2 months ago.  He is also noted that his stool has been darker off and on though not overtly melenic. He has noticed occasional reflux over the past couple of months at times waking him from sleep, no dysphagia or odynophagia.  No complaints of abdominal pain.  Appetite has been fine and weight has been stable. He has been using occasional naproxen and takes a baby aspirin daily. Other medical problems include hypertension, diabetes, hyperlipidemia and diabetic neuropathy.    Review of Systems Pertinent positive and negative review of systems were noted in the above HPI section.  All other review of systems was otherwise negative.  Outpatient Encounter Medications as of 01/22/2020  Medication Sig  . aspirin 81 MG tablet Take 81 mg by mouth daily.  [name] atorvastatin (LIPITOR) 40 MG tablet Take 40 mg by mouth daily.  . empagliflozin (JARDIANCE) 25 MG TABS tablet Take 25 mg by mouth daily.   [name] gabapentin (NEURONTIN) 300 MG capsule Take 300 mg by mouth 3 (three) times daily.  . metFORMIN (GLUCOPHAGE) 1000 MG tablet Take 1,000 mg by mouth 2 (two) times daily with a meal.  . naproxen  (NAPROSYN) 500 MG tablet Take 500 mg by mouth 2 (two) times daily with a meal. As needed  . OZEMPIC, 1 MG/DOSE, 2 MG/1.5ML SOPN Inject 1 mg into the skin once a week.  . telmisartan (MICARDIS) 40 MG tablet Take 40 mg by mouth daily.  Willie [name] FLEXTOUCH 100 UNIT/ML FlexTouch Pen 40 Units.  . Na Sulfate-K Sulfate-Mg Sulf 17.5-3.13-1.6 GM/177ML SOLN Take 1 kit by mouth once for 1 dose.   No facility-administered encounter medications on file as of 01/22/2020.   No Known Allergies Patient Active Problem List   Diagnosis Date Noted  . Diabetic neuropathy with neurologic complication ([place]) 01/75/1025  . Type II diabetes mellitus with ophthalmic manifestations ([place]) 01/19/2020  . Hypertension 01/19/2020  . Hyperlipemia 01/19/2020  . Atherosclerosis of aorta ([place]) 01/19/2020  . Dysphagia 01/19/2020  . Colonic polyp 01/19/2020   Social History   Socioeconomic History  . Marital status: Married    Spouse name: Willie [name]  . Number of children: Not on file  . Years of education: Not on file  . Highest education level: Not on file  Occupational History  . Not on file  Tobacco Use  . Smoking status: Former Research scientist (life sciences)  . Smokeless tobacco: Never Used  Vaping Use  . Vaping Use: Never used  Substance and Sexual Activity  . Alcohol use: Yes    Comment: occasional wine  . Drug use: No  . Sexual activity: Not on file  Other  Topics Concern  . Not on file  Social History Narrative  . Not on file   Social Determinants of Health   Financial Resource Strain:   . Difficulty of Paying Living Expenses:   Food Insecurity: No Food Insecurity  . Worried About Charity fundraiser in the Last Year: Never true  . Ran Out of Food in the Last Year: Never true  Transportation Needs:   . Lack of Transportation (Medical):   [name] Lack of Transportation (Non-Medical):   Physical Activity:   . Days of Exercise per Week:   . Minutes of Exercise per Session:   Stress:   . Feeling of Stress :   Social  Connections:   . Frequency of Communication with Friends and Family:   . Frequency of Social Gatherings with Friends and Family:   . Attends Religious Services:   . Active Member of Clubs or Organizations:   . Attends Archivist Meetings:   [name] Marital Status:   Intimate Partner Violence:   . Fear of Current or Ex-Partner:   . Emotionally Abused:   [name] Physically Abused:   . Sexually Abused:     Mr. Willie [name] family history includes Breast cancer in his sister; CAD in his brother; Diabetes in his maternal grandmother and mother; Heart attack in his father; Lung cancer in his father and paternal grandfather; Parkinson's disease in his mother.      Objective:    Vitals:   01/22/20 0925  BP: 118/70  Pulse: 71    Physical Exam Well-developed well-nourished male/male in no acute distress.  Height, Weight, BMI  HEENT; nontraumatic normocephalic, EOMI, PER R LA, sclera anicteric. Oropharynx;not done Neck; supple, no JVD Cardiovascular; regular rate and rhythm with S1-S2, no murmur rub or gallop Pulmonary; Clear bilaterally Abdomen; soft, nontender, nondistended, no palpable mass or hepatosplenomegaly, bowel sounds are active Rectal;not done - recent documented heme + Skin; benign exam, no jaundice rash or appreciable lesions Extremities; no clubbing cyanosis or edema skin warm and dry Neuro/Psych; alert and oriented x4, grossly nonfocal mood and affect appropriate       Assessment & Plan:   #40  64 year old white male with positive Hemoccult and positive Hemosure. Some mild change in bowel habits over the past couple of months with pasty her stools and occasional post prandial urgency.  This may have coincided around the time he started Ozempic. Has also noted occasional reflux symptoms. We will need to rule out occult colonic or upper gut, neoplasm, chronic gastropathy, consider AVMs.  #2 hypertension 3.  Adult onset diabetes mellitus with neuropathy 4.   Hyperlipidemia  Plan patient will be scheduled for colonoscopy and upper endoscopy with Dr. Havery Moros.  Both procedures were discussed in detail with patient including indications risks and benefits and he is agreeable to proceed. Patient has been vaccinated for COVID-19. Further plans pending results at endoscopic evaluation.  Cheyne Boulden S Marcellino Fidalgo PA-C 01/22/2020   Cc: Marton Redwood, MD

## 2020-03-03 ENCOUNTER — Encounter: Payer: Self-pay | Admitting: Certified Registered Nurse Anesthetist

## 2020-03-04 ENCOUNTER — Ambulatory Visit (AMBULATORY_SURGERY_CENTER): Payer: HMO | Admitting: Gastroenterology

## 2020-03-04 ENCOUNTER — Encounter: Payer: Self-pay | Admitting: Gastroenterology

## 2020-03-04 ENCOUNTER — Other Ambulatory Visit: Payer: Self-pay

## 2020-03-04 VITALS — BP 115/56 | HR 62 | Temp 98.4°F | Resp 16 | Ht 71.0 in | Wt 188.0 lb

## 2020-03-04 DIAGNOSIS — R194 Change in bowel habit: Secondary | ICD-10-CM

## 2020-03-04 DIAGNOSIS — K5730 Diverticulosis of large intestine without perforation or abscess without bleeding: Secondary | ICD-10-CM

## 2020-03-04 DIAGNOSIS — I10 Essential (primary) hypertension: Secondary | ICD-10-CM | POA: Diagnosis not present

## 2020-03-04 DIAGNOSIS — K2100 Gastro-esophageal reflux disease with esophagitis, without bleeding: Secondary | ICD-10-CM

## 2020-03-04 DIAGNOSIS — E119 Type 2 diabetes mellitus without complications: Secondary | ICD-10-CM | POA: Diagnosis not present

## 2020-03-04 DIAGNOSIS — K317 Polyp of stomach and duodenum: Secondary | ICD-10-CM | POA: Diagnosis not present

## 2020-03-04 DIAGNOSIS — Z86010 Personal history of colonic polyps: Secondary | ICD-10-CM

## 2020-03-04 DIAGNOSIS — R195 Other fecal abnormalities: Secondary | ICD-10-CM | POA: Diagnosis not present

## 2020-03-04 DIAGNOSIS — R1310 Dysphagia, unspecified: Secondary | ICD-10-CM | POA: Diagnosis not present

## 2020-03-04 DIAGNOSIS — R197 Diarrhea, unspecified: Secondary | ICD-10-CM | POA: Diagnosis not present

## 2020-03-04 DIAGNOSIS — K225 Diverticulum of esophagus, acquired: Secondary | ICD-10-CM | POA: Diagnosis not present

## 2020-03-04 DIAGNOSIS — D120 Benign neoplasm of cecum: Secondary | ICD-10-CM

## 2020-03-04 DIAGNOSIS — K3189 Other diseases of stomach and duodenum: Secondary | ICD-10-CM | POA: Diagnosis not present

## 2020-03-04 MED ORDER — SODIUM CHLORIDE 0.9 % IV SOLN: 500.0000 mL | Freq: Once | INTRAVENOUS | Status: DC

## 2020-03-04 NOTE — Progress Notes (Signed)
Report given to PACU, vss 

## 2020-03-04 NOTE — Op Note (Signed)
[place] Patient Name: Willie [name] Procedure Date: 03/04/2020 2:24 PM MRN: [id number] Endoscopist: Willie [name] , MD Age: 64 Referring MD:  Date of Birth: 08/15/1955 Gender: Male Account #: [id number] Procedure:                Colonoscopy Indications:              Heme positive stool, last colonoscopy May 2017, few                            small adenomas removed. Change in bowel habits,                            intermittent loose stool Medicines:                Monitored Anesthesia Care Procedure:                Pre-Anesthesia Assessment:                           - Prior to the procedure, a History and Physical                            was performed, and patient medications and                            allergies were reviewed. The patient's tolerance of                            previous anesthesia was also reviewed. The risks                            and benefits of the procedure and the sedation                            options and risks were discussed with the patient.                            All questions were answered, and informed consent                            was obtained. Prior Anticoagulants: The patient has                            taken no previous anticoagulant or antiplatelet                            agents. ASA Grade Assessment: II - A patient with                            mild systemic disease. After reviewing the risks                            and benefits, the patient was deemed in  satisfactory condition to undergo the procedure.                           After obtaining informed consent, the colonoscope                            was passed under direct vision. Throughout the                            procedure, the patient's blood pressure, pulse, and                            oxygen saturations were monitored continuously. The                            Colonoscope was introduced  through the anus and                            advanced to the the terminal ileum, with                            identification of the appendiceal orifice and IC                            valve. The colonoscopy was performed without                            difficulty. The patient tolerated the procedure                            well. The quality of the bowel preparation was                            good. The terminal ileum, ileocecal valve,                            appendiceal orifice, and rectum were photographed. Scope In: 2:54:06 PM Scope Out: 3:16:02 PM Scope Withdrawal Time: 0 hours 18 minutes 22 seconds  Total Procedure Duration: 0 hours 21 minutes 56 seconds  Findings:                 The perianal and digital rectal examinations were                            normal.                           The terminal ileum contained 4 erosions. Biopsies                            were taken with a cold forceps for histology.                           The remainder of the exam in the terminal ileum was  normal.                           A 2 to 3 mm polyp was found in the cecum. The polyp                            was sessile. The polyp was removed with a cold                            snare. Resection and retrieval were complete.                           A few small-mouthed diverticula were found in the                            left colon.                           Internal hemorrhoids were found during                            retroflexion. The hemorrhoids were small.                           The exam was otherwise without abnormality.                           Biopsies for histology were taken with a cold                            forceps from the right colon and left colon for                            evaluation of microscopic colitis. Complications:            No immediate complications. Estimated blood loss:                             Minimal. Estimated Blood Loss:     Estimated blood loss was minimal. Impression:               - A few erosions in the terminal ileum. Biopsied.                           - One 2 to 3 mm polyp in the cecum, removed with a                            cold snare. Resected and retrieved.                           - Diverticulosis in the left colon.                           - Internal hemorrhoids.                           -  The examination was otherwise normal.                           - Biopsies were taken with a cold forceps from the                            right colon and left colon for evaluation of                            microscopic colitis. Recommendation:           - Patient has a contact number available for                            emergencies. The signs and symptoms of potential                            delayed complications were discussed with the                            patient. Return to normal activities tomorrow.                            Written discharge instructions were provided to the                            patient.                           - Resume previous diet.                           - Continue present medications.                           - Await pathology results. Willie Lipps P. Klaire Court, MD 03/04/2020 3:21:16 PM This report has been signed electronically.

## 2020-03-04 NOTE — Progress Notes (Signed)
Called to room to assist during endoscopic procedure.  Patient ID and intended procedure confirmed with present staff. Received instructions for my participation in the procedure from the performing physician.  

## 2020-03-04 NOTE — Op Note (Signed)
[place] Patient Name: Willie [name] Procedure Date: 03/04/2020 2:25 PM MRN: [id number] Endoscopist: Remo Lipps P. Havery Moros , MD Age: 64 Referring MD:  Date of Birth: 24-May-1956 Gender: Male Account #: [id number] Procedure:                Upper GI endoscopy Indications:              Heme positive stool, report of history of dark                            stool previously / loose Medicines:                Monitored Anesthesia Care Procedure:                Pre-Anesthesia Assessment:                           - Prior to the procedure, a History and Physical                            was performed, and patient medications and                            allergies were reviewed. The patient's tolerance of                            previous anesthesia was also reviewed. The risks                            and benefits of the procedure and the sedation                            options and risks were discussed with the patient.                            All questions were answered, and informed consent                            was obtained. Prior Anticoagulants: The patient has                            taken no previous anticoagulant or antiplatelet                            agents. ASA Grade Assessment: II - A patient with                            mild systemic disease. After reviewing the risks                            and benefits, the patient was deemed in                            satisfactory condition to undergo the procedure.  After obtaining informed consent, the endoscope was                            passed under direct vision. Throughout the                            procedure, the patient's blood pressure, pulse, and                            oxygen saturations were monitored continuously. The                            Endoscope was introduced through the mouth, and                            advanced to the second part of  duodenum. The upper                            GI endoscopy was accomplished without difficulty.                            The patient tolerated the procedure well. Scope In: Scope Out: Findings:                 Esophagogastric landmarks were identified: the                            Z-line was found at 40 cm, the gastroesophageal                            junction was found at 40 cm and the upper extent of                            the gastric folds was found at 40 cm from the                            incisors.                           Very mild LA Grade A esophagitis was found 40 cm                            from the incisors.                           The exam of the esophagus was otherwise normal.                           The entire examined stomach was normal.                           A single 3 to 4 mm sessile polyp was found in the  duodenal bulb. The polyp was removed with a cold                            biopsy forceps. Resection and retrieval were                            complete.                           A small diverticulum was found in the second                            portion of the duodenum.                           The exam of the duodenum was otherwise normal. Complications:            No immediate complications. Estimated blood loss:                            Minimal. Estimated Blood Loss:     Estimated blood loss was minimal. Impression:               - Esophagogastric landmarks identified.                           - Very mild esophagitis.                           - Normal esophagus.                           - Normal stomach.                           - A single duodenal polyp. Resected and retrieved.                           - Duodenal diverticulum.                           - Normal duodenum diverticulum                           No high risk lesions for bleeding / heme positive                            stools  on EGD Recommendation:           - Patient has a contact number available for                            emergencies. The signs and symptoms of potential                            delayed complications were discussed with the  patient. Return to normal activities tomorrow.                            Written discharge instructions were provided to the                            patient.                           - Resume previous diet.                           - Continue present medications.                           - Await pathology results.                           - Will need to get records from primary care                            physician to ensure no anemia / iron deficiency                           - Can use pepcid OTC as needed for reflux symptoms Dany Harten P. Havery Moros, MD 03/04/2020 3:26:47 PM This report has been signed electronically.

## 2020-03-04 NOTE — Progress Notes (Signed)
1440 Robinul 0.1 mg IV given due large amount of secretions upon assessment.  MD made aware, vss  

## 2020-03-04 NOTE — Progress Notes (Signed)
V/S-CW  Check-in-CH

## 2020-03-04 NOTE — Patient Instructions (Signed)
YOU HAD AN ENDOSCOPIC PROCEDURE TODAY AT THE [facility]:   Refer to the procedure report that was given to you for any specific questions about what was found during the examination.  If the procedure report does not answer your questions, please call your gastroenterologist to clarify.  If you requested that your care partner not be given the details of your procedure findings, then the procedure report has been included in a sealed envelope for you to review at your convenience later.  YOU SHOULD EXPECT: Some feelings of bloating in the abdomen. Passage of more gas than usual.  Walking can help get rid of the air that was put into your GI tract during the procedure and reduce the bloating. If you had a lower endoscopy (such as a colonoscopy or flexible sigmoidoscopy) you may notice spotting of blood in your stool or on the toilet paper. If you underwent a bowel prep for your procedure, you may not have a normal bowel movement for a few days.  Please Note:  You might notice some irritation and congestion in your nose or some drainage.  This is from the oxygen used during your procedure.  There is no need for concern and it should clear up in a day or so.  SYMPTOMS TO REPORT IMMEDIATELY:   Following lower endoscopy (colonoscopy or flexible sigmoidoscopy):  Excessive amounts of blood in the stool  Significant tenderness or worsening of abdominal pains  Swelling of the abdomen that is new, acute  Fever of 100F or higher   Following upper endoscopy (EGD)  Vomiting of blood or coffee ground material  New chest pain or pain under the shoulder blades  Painful or persistently difficult swallowing  New shortness of breath  Fever of 100F or higher  Black, tarry-looking stools  For urgent or emergent issues, a gastroenterologist can be reached at any hour by calling [phone]. Do not use MyChart messaging for urgent concerns.    DIET:  We do recommend a small meal at first, but  then you may proceed to your regular diet.  Drink plenty of fluids but you should avoid alcoholic beverages for 24 hours.  ACTIVITY:  You should plan to take it easy for the rest of today and you should NOT DRIVE or use heavy machinery until tomorrow (because of the sedation medicines used during the test).    FOLLOW UP: Our staff will call the number listed on your records 48-72 hours following your procedure to check on you and address any questions or concerns that you may have regarding the information given to you following your procedure. If we do not reach you, we will leave a message.  We will attempt to reach you two times.  During this call, we will ask if you have developed any symptoms of COVID 19. If you develop any symptoms (ie: fever, flu-like symptoms, shortness of breath, cough etc.) before then, please call [phone].  If you test positive for Covid 19 in the 2 weeks post procedure, please call and report this information to us.    If any biopsies were taken you will be contacted by phone or by letter within the next 1-3 weeks.  Please call us at [phone] if you have not heard about the biopsies in 3 weeks.    SIGNATURES/CONFIDENTIALITY: You and/or your care partner have signed paperwork which will be entered into your electronic medical record.  These signatures attest to the fact that that the information above on   your After Visit Summary has been reviewed and is understood.  Full responsibility of the confidentiality of this discharge information lies with you and/or your care-partner. 

## 2020-03-06 ENCOUNTER — Telehealth: Payer: Self-pay

## 2020-03-06 NOTE — Telephone Encounter (Signed)
  Follow up Call-  Call back number 03/04/2020  Post procedure Call Back phone  # [phone]  Permission to leave phone message Yes  Some recent data might be hidden     Patient questions:  Do you have a fever, pain , or abdominal swelling? No. Pain Score  0 *  Have you tolerated food without any problems? Yes.    Have you been able to return to your normal activities? Yes.    Do you have any questions about your discharge instructions: Diet   No. Medications  No. Follow up visit  No.  Do you have questions or concerns about your Care? No.  Actions: * If pain score is 4 or above: No action needed, pain <4.  1. Have you developed a fever since your procedure? no  2.   Have you had an respiratory symptoms (SOB or cough) since your procedure? no  3.   Have you tested positive for COVID 19 since your procedure no  4.   Have you had any family members/close contacts diagnosed with the COVID 19 since your procedure?  no   If yes to any of these questions please route to Joylene John, RN and Erenest Rasher, RN

## 2020-03-12 ENCOUNTER — Telehealth: Payer: Self-pay | Admitting: Gastroenterology

## 2020-03-12 NOTE — Telephone Encounter (Signed)
Pt is requesting a call back regarding his results

## 2020-03-13 NOTE — Telephone Encounter (Signed)
See lab results for additional details.  

## 2020-03-15 IMAGING — CT CT NECK W/ CM
2 of 4 series · 5 of 14 positions shown, 6 images · IV contrast (iopamidol)
Comparison: MRI head 11/09/2016

CLINICAL DATA: Lymphadenopathy. Swelling at the angle of the
mandible.

Creatinine was obtained on site at [HOSPITAL] at [HOSPITAL].
Results: Creatinine 0.7 mg/dL.
EXAM:
CT NECK WITH CONTRAST
TECHNIQUE: Multidetector CT imaging of the neck was performed using the
standard protocol following the bolus administration of intravenous
contrast.
CONTRAST:  75mL [id number] IOPAMIDOL ([id number]) INJECTION 61%

[Series 2: neck · axial · 0.46mm/px · z∈[-231,-139]mm · 2 of 139 slices shown, 3 images]
[im 47/139  soft-tissue]
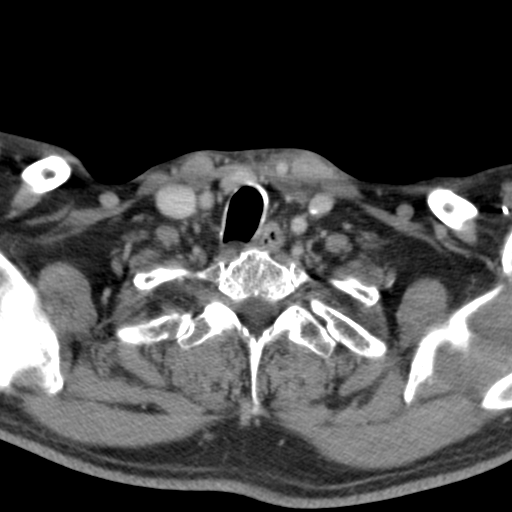
[im 47/139  bone]
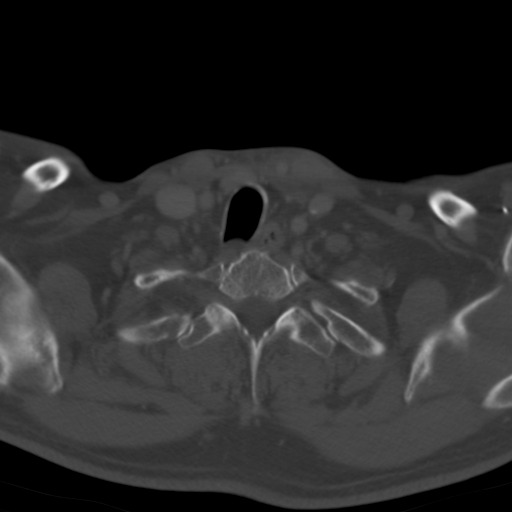
[im 93/139  bone]
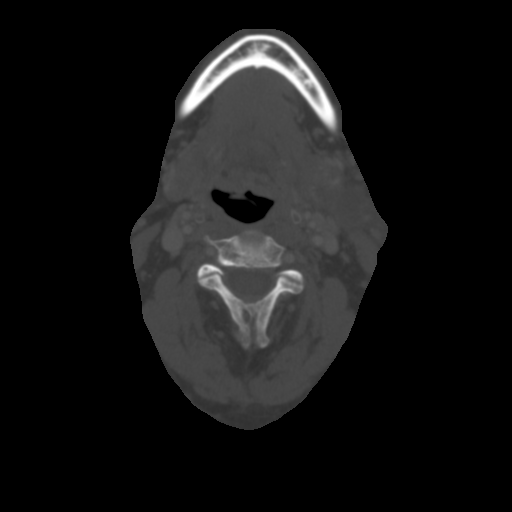

[Series 7: angled axial-oropharynx · axial · 0.39mm/px · z∈[-268,-134]mm · 3 of 138 slices shown]
[im 35/138  bone]
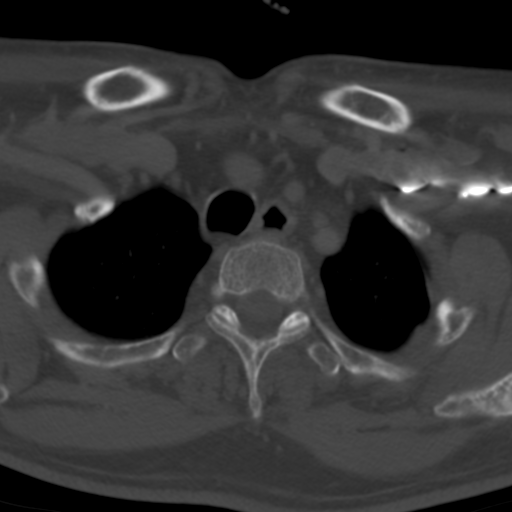
[im 69/138  bone]
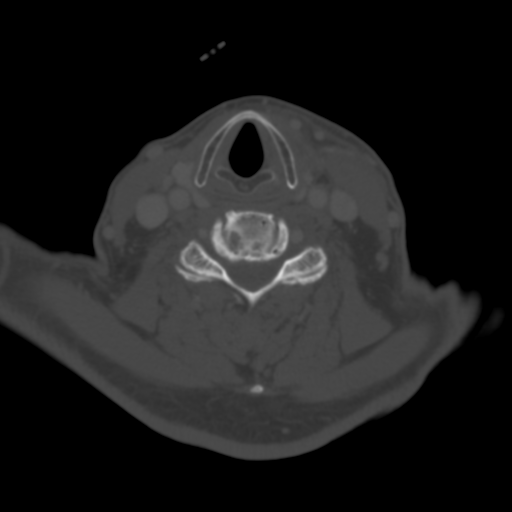
[im 103/138  bone]
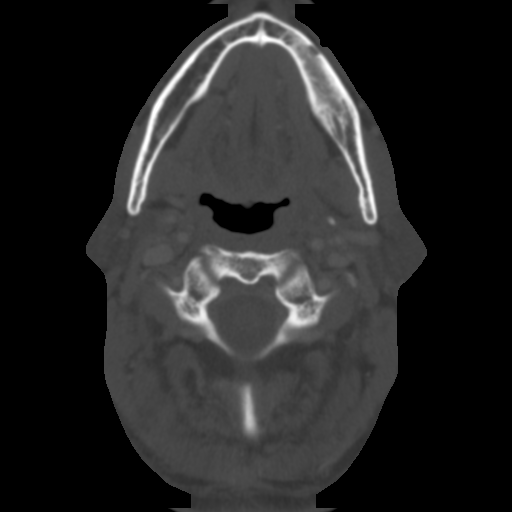

[5 of 14 positions shown; findings below may reference images not displayed]

FINDINGS: Pharynx and larynx: Asymmetric soft tissue is present in the left
posterolateral aspect of the oropharynx and along the left glossal
tonsillar sulcus. No discrete mass lesion is present. This raises
concern for a squamous cell tumor. No other focal mucosal or
submucosal lesions are present. Vocal cords are midline and
symmetric.

Salivary glands: The submandibular and parotid glands are within
normal limits bilaterally. Left level 2 adenopathy is inseparable
from the inferior left parotid gland and posterior left
submandibular gland. This does not appear to be within either gland
though.

Thyroid: A 6 mm nodule is present in the left lobe of thyroid. No
other focal lesions are present. No follow-up is necessary.

Lymph nodes: A necrotic left level 2 lymph node measures 2.4 x 1.6 x
1.8 cm. An adjacent heterogeneous node measures 1.1 x 1.2 x 1.8 cm.
No significant lower nodes are present. There is some stranding
about these nodes. No significant right-sided adenopathy is present.

Vascular: Atherosclerotic calcifications are present at the aortic
arch and carotid bifurcations bilaterally without significant
stenosis.

Limited intracranial: Within normal limits.

Visualized orbits: Inferior orbits are within normal limits.

Mastoids and visualized paranasal sinuses: The paranasal sinuses and
mastoid air cells are clear.

Skeleton: Multilevel endplate degenerative changes are present in
the cervical spine. Osseous foraminal narrowing is evident at C3-4,
C4-5, and C5-6 bilaterally. No focal lytic or blastic lesions are
present.

Upper chest: Lung apices are clear bilaterally.
IMPRESSION: 1. Enlarged necrotic left level 2 lymph node measures 2.4 x 1.6 x
188 cm. An adjacent 1.8 cm heterogeneous node is present. These
nodes are highly concerning for metastatic disease, likely a primary
squamous cell carcinoma.
2. Asymmetric mucosal thickening in the posterior and lateral
oropharynx to the glossal tonsillar sulcus. Although no discrete
lesion is present, this may represent the primary neoplasm.
Recommend direct visual inspection and ENT consultation.
3. Multilevel degenerative changes in the cervical spine.

## 2020-04-09 DIAGNOSIS — E1142 Type 2 diabetes mellitus with diabetic polyneuropathy: Secondary | ICD-10-CM | POA: Diagnosis not present

## 2020-04-09 DIAGNOSIS — Z794 Long term (current) use of insulin: Secondary | ICD-10-CM | POA: Diagnosis not present

## 2020-04-09 DIAGNOSIS — I10 Essential (primary) hypertension: Secondary | ICD-10-CM | POA: Diagnosis not present

## 2020-04-09 DIAGNOSIS — E1139 Type 2 diabetes mellitus with other diabetic ophthalmic complication: Secondary | ICD-10-CM | POA: Diagnosis not present

## 2020-04-09 DIAGNOSIS — E782 Mixed hyperlipidemia: Secondary | ICD-10-CM | POA: Diagnosis not present

## 2020-04-09 DIAGNOSIS — F320 Major depressive disorder, single episode, mild: Secondary | ICD-10-CM | POA: Diagnosis not present

## 2020-06-27 ENCOUNTER — Other Ambulatory Visit: Payer: Self-pay | Admitting: *Deleted

## 2020-06-27 NOTE — Patient Outreach (Signed)
  [facility]) Care Management Chronic Special Needs Program    06/27/2020  Name: Willie [name], DOB: [birth year]  MRN: [id number]   Willie [name] is enrolled in a chronic special needs plan for Diabetes.  [employer] care management team has assumed care and services for this member.  Case closed by [facility] care management.  Jacqlyn Larsen [facility], BSN [place], [place]

## 2020-06-28 DIAGNOSIS — H2513 Age-related nuclear cataract, bilateral: Secondary | ICD-10-CM | POA: Diagnosis not present

## 2020-06-28 DIAGNOSIS — H25041 Posterior subcapsular polar age-related cataract, right eye: Secondary | ICD-10-CM | POA: Diagnosis not present

## 2020-06-28 DIAGNOSIS — H524 Presbyopia: Secondary | ICD-10-CM | POA: Diagnosis not present

## 2020-06-28 DIAGNOSIS — E119 Type 2 diabetes mellitus without complications: Secondary | ICD-10-CM | POA: Diagnosis not present

## 2020-08-13 DIAGNOSIS — Z794 Long term (current) use of insulin: Secondary | ICD-10-CM | POA: Diagnosis not present

## 2020-08-13 DIAGNOSIS — I10 Essential (primary) hypertension: Secondary | ICD-10-CM | POA: Diagnosis not present

## 2020-08-13 DIAGNOSIS — Z23 Encounter for immunization: Secondary | ICD-10-CM | POA: Diagnosis not present

## 2020-08-13 DIAGNOSIS — E1139 Type 2 diabetes mellitus with other diabetic ophthalmic complication: Secondary | ICD-10-CM | POA: Diagnosis not present

## 2020-08-13 DIAGNOSIS — E782 Mixed hyperlipidemia: Secondary | ICD-10-CM | POA: Diagnosis not present

## 2020-08-13 DIAGNOSIS — M7712 Lateral epicondylitis, left elbow: Secondary | ICD-10-CM | POA: Diagnosis not present

## 2020-08-13 DIAGNOSIS — E1142 Type 2 diabetes mellitus with diabetic polyneuropathy: Secondary | ICD-10-CM | POA: Diagnosis not present

## 2020-08-13 DIAGNOSIS — F320 Major depressive disorder, single episode, mild: Secondary | ICD-10-CM | POA: Diagnosis not present

## 2020-09-24 DIAGNOSIS — E1139 Type 2 diabetes mellitus with other diabetic ophthalmic complication: Secondary | ICD-10-CM | POA: Diagnosis not present

## 2020-09-24 DIAGNOSIS — Z794 Long term (current) use of insulin: Secondary | ICD-10-CM | POA: Diagnosis not present

## 2020-09-24 DIAGNOSIS — I10 Essential (primary) hypertension: Secondary | ICD-10-CM | POA: Diagnosis not present

## 2020-10-30 DIAGNOSIS — D692 Other nonthrombocytopenic purpura: Secondary | ICD-10-CM | POA: Diagnosis not present

## 2020-10-30 DIAGNOSIS — D485 Neoplasm of uncertain behavior of skin: Secondary | ICD-10-CM | POA: Diagnosis not present

## 2020-10-30 DIAGNOSIS — L814 Other melanin hyperpigmentation: Secondary | ICD-10-CM | POA: Diagnosis not present

## 2020-10-30 DIAGNOSIS — D1801 Hemangioma of skin and subcutaneous tissue: Secondary | ICD-10-CM | POA: Diagnosis not present

## 2020-10-30 DIAGNOSIS — D1723 Benign lipomatous neoplasm of skin and subcutaneous tissue of right leg: Secondary | ICD-10-CM | POA: Diagnosis not present

## 2020-10-30 DIAGNOSIS — D171 Benign lipomatous neoplasm of skin and subcutaneous tissue of trunk: Secondary | ICD-10-CM | POA: Diagnosis not present

## 2020-10-30 DIAGNOSIS — L738 Other specified follicular disorders: Secondary | ICD-10-CM | POA: Diagnosis not present

## 2020-10-30 DIAGNOSIS — L918 Other hypertrophic disorders of the skin: Secondary | ICD-10-CM | POA: Diagnosis not present

## 2020-10-30 DIAGNOSIS — L821 Other seborrheic keratosis: Secondary | ICD-10-CM | POA: Diagnosis not present

## 2020-10-30 DIAGNOSIS — L570 Actinic keratosis: Secondary | ICD-10-CM | POA: Diagnosis not present

## 2020-11-05 DIAGNOSIS — E782 Mixed hyperlipidemia: Secondary | ICD-10-CM | POA: Diagnosis not present

## 2020-11-05 DIAGNOSIS — I10 Essential (primary) hypertension: Secondary | ICD-10-CM | POA: Diagnosis not present

## 2020-11-05 DIAGNOSIS — Z794 Long term (current) use of insulin: Secondary | ICD-10-CM | POA: Diagnosis not present

## 2020-11-05 DIAGNOSIS — E1139 Type 2 diabetes mellitus with other diabetic ophthalmic complication: Secondary | ICD-10-CM | POA: Diagnosis not present

## 2020-11-12 ENCOUNTER — Ambulatory Visit: Payer: PPO | Admitting: *Deleted

## 2020-11-27 ENCOUNTER — Other Ambulatory Visit: Payer: Self-pay | Admitting: Podiatry

## 2020-11-27 ENCOUNTER — Ambulatory Visit (INDEPENDENT_AMBULATORY_CARE_PROVIDER_SITE_OTHER): Payer: HMO

## 2020-11-27 ENCOUNTER — Ambulatory Visit: Payer: HMO | Admitting: Podiatry

## 2020-11-27 ENCOUNTER — Other Ambulatory Visit: Payer: Self-pay

## 2020-11-27 DIAGNOSIS — Z86010 Personal history of colonic polyps: Secondary | ICD-10-CM | POA: Insufficient documentation

## 2020-11-27 DIAGNOSIS — M79672 Pain in left foot: Secondary | ICD-10-CM

## 2020-11-27 DIAGNOSIS — E0843 Diabetes mellitus due to underlying condition with diabetic autonomic (poly)neuropathy: Secondary | ICD-10-CM

## 2020-11-27 DIAGNOSIS — M216X1 Other acquired deformities of right foot: Secondary | ICD-10-CM

## 2020-11-27 DIAGNOSIS — M2042 Other hammer toe(s) (acquired), left foot: Secondary | ICD-10-CM

## 2020-11-27 DIAGNOSIS — M2041 Other hammer toe(s) (acquired), right foot: Secondary | ICD-10-CM | POA: Diagnosis not present

## 2020-11-27 DIAGNOSIS — M216X2 Other acquired deformities of left foot: Secondary | ICD-10-CM

## 2020-11-27 DIAGNOSIS — F320 Major depressive disorder, single episode, mild: Secondary | ICD-10-CM | POA: Insufficient documentation

## 2020-11-27 DIAGNOSIS — Z0000 Encounter for general adult medical examination without abnormal findings: Secondary | ICD-10-CM | POA: Insufficient documentation

## 2020-11-27 DIAGNOSIS — M79671 Pain in right foot: Secondary | ICD-10-CM

## 2020-11-27 NOTE — Progress Notes (Signed)
   HPI: 65 y.o. male presenting today as a new patient for evaluation of bilateral foot pain this been going on for several years now.  He currently takes gabapentin 300 mg 3 times daily as needed.  He is also tried different pair shoes which did not help alleviate any of his symptoms.  He presents for further treatment and evaluation  Past Medical History:  Diagnosis Date  . Adenomatous polyp   . Atherosclerosis of aorta ([place])   . Cataract   . Diabetes mellitus without complication ([place])   . Hearing loss, right   . Hyperlipidemia   . Hypertension   . MRSA infection      Physical Exam: General: The patient is alert and oriented x3 in no acute distress.  Dermatology: Skin is warm, dry and supple bilateral lower extremities. Negative for open lesions or macerations.  Vascular: Palpable pedal pulses bilaterally. No edema or erythema noted. Capillary refill within normal limits.  Neurological: Epicritic and protective threshold diminished bilaterally.   Musculoskeletal Exam: Range of motion within normal limits to all pedal and ankle joints bilateral. Muscle strength 5/5 in all groups bilateral.  Increased medial longitudinal arches noted with excessive loading of the lateral column bilateral feet.  There is also reducible hammertoe contracture digits 2-5 bilateral  Assessment: 1.  Pes cavus bilateral 2.  Excessive lateral column loading 3.  Hammertoes 2-5 bilateral 4.  Diabetes mellitus with peripheral polyneuropathy   Plan of Care:  1. Patient evaluated.  2.  Comprehensive diabetic foot exam performed today 3.  Continue gabapentin 300 mg 3 times daily as needed 4.  Appointment with Pedorthist for custom molded diabetic insoles and shoes 5.  Return to clinic annually    Edrick Kins, DPM [facility]  Dr. Edrick Kins, DPM    [address], [place] [address]                Office [phone]  Fax [phone]

## 2020-12-03 ENCOUNTER — Ambulatory Visit (INDEPENDENT_AMBULATORY_CARE_PROVIDER_SITE_OTHER): Payer: HMO | Admitting: Podiatry

## 2020-12-03 ENCOUNTER — Other Ambulatory Visit: Payer: Self-pay

## 2020-12-03 DIAGNOSIS — M2042 Other hammer toe(s) (acquired), left foot: Secondary | ICD-10-CM

## 2020-12-03 DIAGNOSIS — M2041 Other hammer toe(s) (acquired), right foot: Secondary | ICD-10-CM

## 2020-12-03 DIAGNOSIS — E0843 Diabetes mellitus due to underlying condition with diabetic autonomic (poly)neuropathy: Secondary | ICD-10-CM

## 2020-12-03 NOTE — Progress Notes (Signed)
Patient presented for foam casting for 3 pair custom diabetic shoe inserts. Patient is measured with a [place] Device to be a size 10 1/2 wide  Diabetic shoes are chosen from the [employer] catalog.  The shoes chosen are  521  The patient will be contacted when the shoes and inserts are ready to be picked up

## 2020-12-04 ENCOUNTER — Other Ambulatory Visit: Payer: HMO

## 2020-12-20 DIAGNOSIS — Z125 Encounter for screening for malignant neoplasm of prostate: Secondary | ICD-10-CM | POA: Diagnosis not present

## 2020-12-20 DIAGNOSIS — E1165 Type 2 diabetes mellitus with hyperglycemia: Secondary | ICD-10-CM | POA: Diagnosis not present

## 2020-12-20 DIAGNOSIS — E782 Mixed hyperlipidemia: Secondary | ICD-10-CM | POA: Diagnosis not present

## 2020-12-27 DIAGNOSIS — I10 Essential (primary) hypertension: Secondary | ICD-10-CM | POA: Diagnosis not present

## 2020-12-27 DIAGNOSIS — F320 Major depressive disorder, single episode, mild: Secondary | ICD-10-CM | POA: Diagnosis not present

## 2020-12-27 DIAGNOSIS — R1310 Dysphagia, unspecified: Secondary | ICD-10-CM | POA: Diagnosis not present

## 2020-12-27 DIAGNOSIS — E782 Mixed hyperlipidemia: Secondary | ICD-10-CM | POA: Diagnosis not present

## 2020-12-27 DIAGNOSIS — E1142 Type 2 diabetes mellitus with diabetic polyneuropathy: Secondary | ICD-10-CM | POA: Diagnosis not present

## 2020-12-27 DIAGNOSIS — E1139 Type 2 diabetes mellitus with other diabetic ophthalmic complication: Secondary | ICD-10-CM | POA: Diagnosis not present

## 2020-12-27 DIAGNOSIS — Z0000 Encounter for general adult medical examination without abnormal findings: Secondary | ICD-10-CM | POA: Diagnosis not present

## 2020-12-27 DIAGNOSIS — I700 Atherosclerosis of aorta: Secondary | ICD-10-CM | POA: Diagnosis not present

## 2020-12-27 DIAGNOSIS — Z794 Long term (current) use of insulin: Secondary | ICD-10-CM | POA: Diagnosis not present

## 2020-12-27 DIAGNOSIS — H93A2 Pulsatile tinnitus, left ear: Secondary | ICD-10-CM | POA: Diagnosis not present

## 2020-12-27 DIAGNOSIS — Z86010 Personal history of colonic polyps: Secondary | ICD-10-CM | POA: Diagnosis not present

## 2021-01-22 ENCOUNTER — Ambulatory Visit: Payer: HMO

## 2021-01-22 ENCOUNTER — Other Ambulatory Visit: Payer: Self-pay

## 2021-01-22 DIAGNOSIS — E0843 Diabetes mellitus due to underlying condition with diabetic autonomic (poly)neuropathy: Secondary | ICD-10-CM

## 2021-01-22 DIAGNOSIS — M216X1 Other acquired deformities of right foot: Secondary | ICD-10-CM

## 2021-01-22 DIAGNOSIS — M216X2 Other acquired deformities of left foot: Secondary | ICD-10-CM

## 2021-01-22 DIAGNOSIS — E1140 Type 2 diabetes mellitus with diabetic neuropathy, unspecified: Secondary | ICD-10-CM | POA: Diagnosis not present

## 2021-01-22 DIAGNOSIS — M2042 Other hammer toe(s) (acquired), left foot: Secondary | ICD-10-CM | POA: Diagnosis not present

## 2021-01-22 DIAGNOSIS — M2041 Other hammer toe(s) (acquired), right foot: Secondary | ICD-10-CM | POA: Diagnosis not present

## 2021-01-22 NOTE — Progress Notes (Signed)
Patient picked up diabetic shoes and custom inserts. Patient tried them on in office mentioned they seem fine. Went through and read delivery instructions and return policy with patient, he understood and signed form.   Patient is follow up with Dr Amalia Hailey if needed

## 2021-03-05 DIAGNOSIS — R002 Palpitations: Secondary | ICD-10-CM | POA: Diagnosis not present

## 2021-03-05 DIAGNOSIS — I10 Essential (primary) hypertension: Secondary | ICD-10-CM | POA: Diagnosis not present

## 2021-04-09 DIAGNOSIS — Z794 Long term (current) use of insulin: Secondary | ICD-10-CM | POA: Diagnosis not present

## 2021-04-09 DIAGNOSIS — E1139 Type 2 diabetes mellitus with other diabetic ophthalmic complication: Secondary | ICD-10-CM | POA: Diagnosis not present

## 2021-04-09 DIAGNOSIS — E782 Mixed hyperlipidemia: Secondary | ICD-10-CM | POA: Diagnosis not present

## 2021-04-09 DIAGNOSIS — I10 Essential (primary) hypertension: Secondary | ICD-10-CM | POA: Diagnosis not present

## 2021-06-30 DIAGNOSIS — Z23 Encounter for immunization: Secondary | ICD-10-CM | POA: Diagnosis not present

## 2021-06-30 DIAGNOSIS — I700 Atherosclerosis of aorta: Secondary | ICD-10-CM | POA: Diagnosis not present

## 2021-06-30 DIAGNOSIS — F320 Major depressive disorder, single episode, mild: Secondary | ICD-10-CM | POA: Diagnosis not present

## 2021-06-30 DIAGNOSIS — R1310 Dysphagia, unspecified: Secondary | ICD-10-CM | POA: Diagnosis not present

## 2021-06-30 DIAGNOSIS — Z794 Long term (current) use of insulin: Secondary | ICD-10-CM | POA: Diagnosis not present

## 2021-06-30 DIAGNOSIS — I10 Essential (primary) hypertension: Secondary | ICD-10-CM | POA: Diagnosis not present

## 2021-06-30 DIAGNOSIS — H93A2 Pulsatile tinnitus, left ear: Secondary | ICD-10-CM | POA: Diagnosis not present

## 2021-06-30 DIAGNOSIS — Z86010 Personal history of colonic polyps: Secondary | ICD-10-CM | POA: Diagnosis not present

## 2021-06-30 DIAGNOSIS — E1142 Type 2 diabetes mellitus with diabetic polyneuropathy: Secondary | ICD-10-CM | POA: Diagnosis not present

## 2021-06-30 DIAGNOSIS — E782 Mixed hyperlipidemia: Secondary | ICD-10-CM | POA: Diagnosis not present

## 2021-06-30 DIAGNOSIS — E1139 Type 2 diabetes mellitus with other diabetic ophthalmic complication: Secondary | ICD-10-CM | POA: Diagnosis not present

## 2021-06-30 DIAGNOSIS — Z13828 Encounter for screening for other musculoskeletal disorder: Secondary | ICD-10-CM | POA: Diagnosis not present

## 2021-07-07 DIAGNOSIS — H5203 Hypermetropia, bilateral: Secondary | ICD-10-CM | POA: Diagnosis not present

## 2021-07-07 DIAGNOSIS — E113293 Type 2 diabetes mellitus with mild nonproliferative diabetic retinopathy without macular edema, bilateral: Secondary | ICD-10-CM | POA: Diagnosis not present

## 2021-07-07 DIAGNOSIS — H25011 Cortical age-related cataract, right eye: Secondary | ICD-10-CM | POA: Diagnosis not present

## 2021-07-07 DIAGNOSIS — H2513 Age-related nuclear cataract, bilateral: Secondary | ICD-10-CM | POA: Diagnosis not present

## 2021-07-22 DIAGNOSIS — I10 Essential (primary) hypertension: Secondary | ICD-10-CM | POA: Diagnosis not present

## 2021-07-22 DIAGNOSIS — F3341 Major depressive disorder, recurrent, in partial remission: Secondary | ICD-10-CM | POA: Diagnosis not present

## 2021-07-22 DIAGNOSIS — E1169 Type 2 diabetes mellitus with other specified complication: Secondary | ICD-10-CM | POA: Diagnosis not present

## 2021-07-22 DIAGNOSIS — M1990 Unspecified osteoarthritis, unspecified site: Secondary | ICD-10-CM | POA: Diagnosis not present

## 2021-07-22 DIAGNOSIS — E1165 Type 2 diabetes mellitus with hyperglycemia: Secondary | ICD-10-CM | POA: Diagnosis not present

## 2021-07-22 DIAGNOSIS — E1142 Type 2 diabetes mellitus with diabetic polyneuropathy: Secondary | ICD-10-CM | POA: Diagnosis not present

## 2021-07-22 DIAGNOSIS — E663 Overweight: Secondary | ICD-10-CM | POA: Diagnosis not present

## 2021-07-22 DIAGNOSIS — E1136 Type 2 diabetes mellitus with diabetic cataract: Secondary | ICD-10-CM | POA: Diagnosis not present

## 2021-07-22 DIAGNOSIS — Z794 Long term (current) use of insulin: Secondary | ICD-10-CM | POA: Diagnosis not present

## 2021-07-22 DIAGNOSIS — E785 Hyperlipidemia, unspecified: Secondary | ICD-10-CM | POA: Diagnosis not present

## 2021-07-22 DIAGNOSIS — D692 Other nonthrombocytopenic purpura: Secondary | ICD-10-CM | POA: Diagnosis not present

## 2021-07-22 DIAGNOSIS — G8929 Other chronic pain: Secondary | ICD-10-CM | POA: Diagnosis not present

## 2021-09-16 ENCOUNTER — Encounter (HOSPITAL_COMMUNITY): Payer: Self-pay | Admitting: Emergency Medicine

## 2021-09-16 ENCOUNTER — Emergency Department (HOSPITAL_COMMUNITY): Payer: HMO

## 2021-09-16 ENCOUNTER — Inpatient Hospital Stay (HOSPITAL_COMMUNITY)
Admission: EM | Admit: 2021-09-16 | Discharge: 2021-09-21 | DRG: 871 | Disposition: A | Discharge status: Home / Self Care | Payer: HMO | Attending: Internal Medicine | Admitting: Internal Medicine

## 2021-09-16 DIAGNOSIS — B9789 Other viral agents as the cause of diseases classified elsewhere: Secondary | ICD-10-CM | POA: Diagnosis present

## 2021-09-16 DIAGNOSIS — E785 Hyperlipidemia, unspecified: Secondary | ICD-10-CM | POA: Diagnosis present

## 2021-09-16 DIAGNOSIS — K567 Ileus, unspecified: Secondary | ICD-10-CM | POA: Diagnosis present

## 2021-09-16 DIAGNOSIS — K449 Diaphragmatic hernia without obstruction or gangrene: Secondary | ICD-10-CM | POA: Diagnosis present

## 2021-09-16 DIAGNOSIS — J13 Pneumonia due to Streptococcus pneumoniae: Secondary | ICD-10-CM | POA: Diagnosis present

## 2021-09-16 DIAGNOSIS — R739 Hyperglycemia, unspecified: Secondary | ICD-10-CM

## 2021-09-16 DIAGNOSIS — R519 Headache, unspecified: Secondary | ICD-10-CM | POA: Diagnosis not present

## 2021-09-16 DIAGNOSIS — I4891 Unspecified atrial fibrillation: Secondary | ICD-10-CM | POA: Diagnosis present

## 2021-09-16 DIAGNOSIS — K5730 Diverticulosis of large intestine without perforation or abscess without bleeding: Secondary | ICD-10-CM | POA: Diagnosis not present

## 2021-09-16 DIAGNOSIS — R197 Diarrhea, unspecified: Secondary | ICD-10-CM | POA: Diagnosis not present

## 2021-09-16 DIAGNOSIS — E8721 Acute metabolic acidosis: Secondary | ICD-10-CM | POA: Diagnosis not present

## 2021-09-16 DIAGNOSIS — Z833 Family history of diabetes mellitus: Secondary | ICD-10-CM | POA: Diagnosis not present

## 2021-09-16 DIAGNOSIS — Z7985 Long-term (current) use of injectable non-insulin antidiabetic drugs: Secondary | ICD-10-CM

## 2021-09-16 DIAGNOSIS — K2100 Gastro-esophageal reflux disease with esophagitis, without bleeding: Secondary | ICD-10-CM | POA: Diagnosis present

## 2021-09-16 DIAGNOSIS — Z794 Long term (current) use of insulin: Secondary | ICD-10-CM

## 2021-09-16 DIAGNOSIS — E860 Dehydration: Secondary | ICD-10-CM | POA: Diagnosis not present

## 2021-09-16 DIAGNOSIS — B9710 Unspecified enterovirus as the cause of diseases classified elsewhere: Secondary | ICD-10-CM | POA: Diagnosis present

## 2021-09-16 DIAGNOSIS — E1142 Type 2 diabetes mellitus with diabetic polyneuropathy: Secondary | ICD-10-CM | POA: Diagnosis present

## 2021-09-16 DIAGNOSIS — H9191 Unspecified hearing loss, right ear: Secondary | ICD-10-CM | POA: Diagnosis present

## 2021-09-16 DIAGNOSIS — I10 Essential (primary) hypertension: Secondary | ICD-10-CM | POA: Diagnosis present

## 2021-09-16 DIAGNOSIS — Z79899 Other long term (current) drug therapy: Secondary | ICD-10-CM

## 2021-09-16 DIAGNOSIS — Z8614 Personal history of Methicillin resistant Staphylococcus aureus infection: Secondary | ICD-10-CM | POA: Diagnosis not present

## 2021-09-16 DIAGNOSIS — R059 Cough, unspecified: Secondary | ICD-10-CM | POA: Diagnosis not present

## 2021-09-16 DIAGNOSIS — J189 Pneumonia, unspecified organism: Secondary | ICD-10-CM | POA: Diagnosis not present

## 2021-09-16 DIAGNOSIS — R159 Full incontinence of feces: Secondary | ICD-10-CM | POA: Diagnosis present

## 2021-09-16 DIAGNOSIS — R531 Weakness: Secondary | ICD-10-CM

## 2021-09-16 DIAGNOSIS — Z87891 Personal history of nicotine dependence: Secondary | ICD-10-CM | POA: Diagnosis not present

## 2021-09-16 DIAGNOSIS — K760 Fatty (change of) liver, not elsewhere classified: Secondary | ICD-10-CM | POA: Diagnosis not present

## 2021-09-16 DIAGNOSIS — E876 Hypokalemia: Secondary | ICD-10-CM | POA: Diagnosis present

## 2021-09-16 DIAGNOSIS — K3184 Gastroparesis: Secondary | ICD-10-CM | POA: Diagnosis not present

## 2021-09-16 DIAGNOSIS — E872 Acidosis, unspecified: Secondary | ICD-10-CM | POA: Diagnosis not present

## 2021-09-16 DIAGNOSIS — E1149 Type 2 diabetes mellitus with other diabetic neurological complication: Secondary | ICD-10-CM | POA: Diagnosis not present

## 2021-09-16 DIAGNOSIS — R45851 Suicidal ideations: Secondary | ICD-10-CM | POA: Diagnosis present

## 2021-09-16 DIAGNOSIS — I700 Atherosclerosis of aorta: Secondary | ICD-10-CM | POA: Diagnosis present

## 2021-09-16 DIAGNOSIS — Z7982 Long term (current) use of aspirin: Secondary | ICD-10-CM

## 2021-09-16 DIAGNOSIS — Z20822 Contact with and (suspected) exposure to covid-19: Secondary | ICD-10-CM | POA: Diagnosis present

## 2021-09-16 DIAGNOSIS — R0902 Hypoxemia: Secondary | ICD-10-CM | POA: Diagnosis present

## 2021-09-16 DIAGNOSIS — N390 Urinary tract infection, site not specified: Secondary | ICD-10-CM | POA: Diagnosis present

## 2021-09-16 DIAGNOSIS — Z7984 Long term (current) use of oral hypoglycemic drugs: Secondary | ICD-10-CM

## 2021-09-16 DIAGNOSIS — E782 Mixed hyperlipidemia: Secondary | ICD-10-CM | POA: Diagnosis not present

## 2021-09-16 DIAGNOSIS — R6520 Severe sepsis without septic shock: Secondary | ICD-10-CM | POA: Diagnosis not present

## 2021-09-16 DIAGNOSIS — E1110 Type 2 diabetes mellitus with ketoacidosis without coma: Secondary | ICD-10-CM | POA: Diagnosis present

## 2021-09-16 DIAGNOSIS — D72829 Elevated white blood cell count, unspecified: Secondary | ICD-10-CM | POA: Diagnosis not present

## 2021-09-16 DIAGNOSIS — N200 Calculus of kidney: Secondary | ICD-10-CM | POA: Diagnosis not present

## 2021-09-16 DIAGNOSIS — R079 Chest pain, unspecified: Secondary | ICD-10-CM | POA: Diagnosis not present

## 2021-09-16 DIAGNOSIS — R9431 Abnormal electrocardiogram [ECG] [EKG]: Secondary | ICD-10-CM | POA: Diagnosis not present

## 2021-09-16 DIAGNOSIS — R918 Other nonspecific abnormal finding of lung field: Secondary | ICD-10-CM | POA: Diagnosis not present

## 2021-09-16 DIAGNOSIS — E1165 Type 2 diabetes mellitus with hyperglycemia: Secondary | ICD-10-CM | POA: Diagnosis not present

## 2021-09-16 DIAGNOSIS — Z1152 Encounter for screening for COVID-19: Secondary | ICD-10-CM | POA: Diagnosis not present

## 2021-09-16 DIAGNOSIS — Z8249 Family history of ischemic heart disease and other diseases of the circulatory system: Secondary | ICD-10-CM

## 2021-09-16 DIAGNOSIS — R112 Nausea with vomiting, unspecified: Secondary | ICD-10-CM | POA: Diagnosis not present

## 2021-09-16 DIAGNOSIS — E1140 Type 2 diabetes mellitus with diabetic neuropathy, unspecified: Secondary | ICD-10-CM | POA: Diagnosis present

## 2021-09-16 DIAGNOSIS — A419 Sepsis, unspecified organism: Principal | ICD-10-CM | POA: Diagnosis present

## 2021-09-16 DIAGNOSIS — R1013 Epigastric pain: Secondary | ICD-10-CM | POA: Diagnosis not present

## 2021-09-16 DIAGNOSIS — R1110 Vomiting, unspecified: Secondary | ICD-10-CM | POA: Diagnosis not present

## 2021-09-16 LAB — CBC WITH DIFFERENTIAL/PLATELET
Abs Immature Granulocytes: 0.56 10*3/uL — ABNORMAL HIGH (ref 0.00–0.07)
Basophils Absolute: 0.2 10*3/uL — ABNORMAL HIGH (ref 0.0–0.1)
Basophils Relative: 1 %
Eosinophils Absolute: 0.1 10*3/uL (ref 0.0–0.5)
Eosinophils Relative: 0 %
HCT: 51.2 % (ref 39.0–52.0)
Hemoglobin: 17 g/dL (ref 13.0–17.0)
Immature Granulocytes: 3 %
Lymphocytes Relative: 2 %
Lymphs Abs: 0.4 10*3/uL — ABNORMAL LOW (ref 0.7–4.0)
MCH: 30.6 pg (ref 26.0–34.0)
MCHC: 33.2 g/dL (ref 30.0–36.0)
MCV: 92.1 fL (ref 80.0–100.0)
Monocytes Absolute: 1 10*3/uL (ref 0.1–1.0)
Monocytes Relative: 4 %
Neutro Abs: 20.2 10*3/uL — ABNORMAL HIGH (ref 1.7–7.7)
Neutrophils Relative %: 90 %
Platelets: 397 10*3/uL (ref 150–400)
RBC: 5.56 MIL/uL (ref 4.22–5.81)
RDW: 12.6 % (ref 11.5–15.5)
WBC: 22.3 10*3/uL — ABNORMAL HIGH (ref 4.0–10.5)
nRBC: 0 % (ref 0.0–0.2)

## 2021-09-16 LAB — COMPREHENSIVE METABOLIC PANEL
ALT: 16 U/L (ref 0–44)
AST: 12 U/L — ABNORMAL LOW (ref 15–41)
Albumin: 4.1 g/dL (ref 3.5–5.0)
Alkaline Phosphatase: 119 U/L (ref 38–126)
BUN: 36 mg/dL — ABNORMAL HIGH (ref 8–23)
CO2: <7 mmol/L — ABNORMAL LOW (ref 22–32)
Calcium: 9.9 mg/dL (ref 8.9–10.3)
Chloride: 98 mmol/L (ref 98–111)
Creatinine, Ser: 1.76 mg/dL — ABNORMAL HIGH (ref 0.61–1.24)
GFR, Estimated: 42 mL/min — ABNORMAL LOW (ref >60)
Glucose, Bld: 259 mg/dL — ABNORMAL HIGH (ref 70–99)
Potassium: 4.8 mmol/L (ref 3.5–5.1)
Sodium: 132 mmol/L — ABNORMAL LOW (ref 135–145)
Total Bilirubin: 1.1 mg/dL (ref 0.3–1.2)
Total Protein: 8.7 g/dL — ABNORMAL HIGH (ref 6.5–8.1)

## 2021-09-16 LAB — RESP PANEL BY RT-PCR (FLU A&B, COVID) ARPGX2
Influenza A by PCR: NEGATIVE
Influenza B by PCR: NEGATIVE
SARS Coronavirus 2 by RT PCR: NEGATIVE

## 2021-09-16 LAB — I-STAT VENOUS BLOOD GAS, ED
Acid-base deficit: 20 mmol/L — ABNORMAL HIGH (ref 0.0–2.0)
Bicarbonate: 5.6 mmol/L — ABNORMAL LOW (ref 20.0–28.0)
Calcium, Ion: 1.19 mmol/L (ref 1.15–1.40)
HCT: 51 % (ref 39.0–52.0)
Hemoglobin: 17.3 g/dL — ABNORMAL HIGH (ref 13.0–17.0)
O2 Saturation: 92 %
Potassium: 4.5 mmol/L (ref 3.5–5.1)
Sodium: 133 mmol/L — ABNORMAL LOW (ref 135–145)
TCO2: 6 mmol/L — ABNORMAL LOW (ref 22–32)
pCO2, Ven: 15.2 mmHg — CL (ref 44.0–60.0)
pH, Ven: 7.178 — CL (ref 7.250–7.430)
pO2, Ven: 75 mmHg — ABNORMAL HIGH (ref 32.0–45.0)

## 2021-09-16 LAB — URINALYSIS, ROUTINE W REFLEX MICROSCOPIC
Bilirubin Urine: NEGATIVE
Glucose, UA: >=500 mg/dL — AB
Ketones, ur: 80 mg/dL — AB
Leukocytes,Ua: NEGATIVE
Nitrite: NEGATIVE
Protein, ur: 100 mg/dL — AB
Specific Gravity, Urine: 1.022 (ref 1.005–1.030)
pH: 5 (ref 5.0–8.0)

## 2021-09-16 LAB — BETA-HYDROXYBUTYRIC ACID: Beta-Hydroxybutyric Acid: >8 mmol/L — ABNORMAL HIGH (ref 0.05–0.27)

## 2021-09-16 LAB — LACTIC ACID, PLASMA
Lactic Acid, Venous: 2.2 mmol/L (ref 0.5–1.9)
Lactic Acid, Venous: 2.2 mmol/L (ref 0.5–1.9)

## 2021-09-16 LAB — APTT: aPTT: 33 seconds (ref 24–36)

## 2021-09-16 LAB — CBG MONITORING, ED: Glucose-Capillary: 349 mg/dL — ABNORMAL HIGH (ref 70–99)

## 2021-09-16 LAB — TROPONIN I (HIGH SENSITIVITY)
Troponin I (High Sensitivity): 23 ng/L — ABNORMAL HIGH (ref <18)
Troponin I (High Sensitivity): 23 ng/L — ABNORMAL HIGH (ref <18)

## 2021-09-16 LAB — PROTIME-INR
INR: 1.2 (ref 0.8–1.2)
Prothrombin Time: 14.7 seconds (ref 11.4–15.2)

## 2021-09-16 LAB — LIPASE, BLOOD: Lipase: 56 U/L — ABNORMAL HIGH (ref 11–51)

## 2021-09-16 MED ORDER — SODIUM CHLORIDE 0.9 % IV SOLN
2.0000 g | Freq: Once | INTRAVENOUS | Status: AC
  Administered 2021-09-16: 2 g via INTRAVENOUS
  Filled 2021-09-16: qty 2

## 2021-09-16 MED ORDER — VANCOMYCIN HCL IN DEXTROSE 1-5 GM/200ML-% IV SOLN: 1000.0000 mg | Freq: Once | INTRAVENOUS | Status: DC

## 2021-09-16 MED ORDER — LACTATED RINGERS IV BOLUS (SEPSIS)
250.0000 mL | Freq: Once | INTRAVENOUS | Status: AC
  Administered 2021-09-17: 250 mL via INTRAVENOUS

## 2021-09-16 MED ORDER — INSULIN REGULAR(HUMAN) IN NACL 100-0.9 UT/100ML-% IV SOLN
INTRAVENOUS | Status: DC
  Administered 2021-09-17: 11 [IU]/h via INTRAVENOUS
  Filled 2021-09-16: qty 100

## 2021-09-16 MED ORDER — LACTATED RINGERS IV BOLUS: 1000.0000 mL | Freq: Once | INTRAVENOUS | Status: DC

## 2021-09-16 MED ORDER — SODIUM BICARBONATE 8.4 % IV SOLN
50.0000 meq | Freq: Once | INTRAVENOUS | Status: AC
Start: 2021-09-16 — End: 2021-09-16
  Administered 2021-09-16: 50 meq via INTRAVENOUS
  Filled 2021-09-16: qty 50

## 2021-09-16 MED ORDER — DEXTROSE 50 % IV SOLN: 0.0000 mL | INTRAVENOUS | Status: DC | PRN

## 2021-09-16 MED ORDER — METRONIDAZOLE 500 MG/100ML IV SOLN
500.0000 mg | Freq: Once | INTRAVENOUS | Status: AC
  Administered 2021-09-16: 500 mg via INTRAVENOUS
  Filled 2021-09-16: qty 100

## 2021-09-16 MED ORDER — SODIUM CHLORIDE 0.9 % IV SOLN: 2.0000 g | Freq: Two times a day (BID) | INTRAVENOUS | Status: DC

## 2021-09-16 MED ORDER — LACTATED RINGERS IV BOLUS: 2000.0000 mL | Freq: Once | INTRAVENOUS | Status: DC

## 2021-09-16 MED ORDER — LACTATED RINGERS IV BOLUS (SEPSIS)
1000.0000 mL | Freq: Once | INTRAVENOUS | Status: AC
  Administered 2021-09-16: 1000 mL via INTRAVENOUS

## 2021-09-16 MED ORDER — ONDANSETRON HCL 4 MG/2ML IJ SOLN
4.0000 mg | Freq: Once | INTRAMUSCULAR | Status: AC
  Administered 2021-09-16: 4 mg via INTRAVENOUS
  Filled 2021-09-16: qty 2

## 2021-09-16 MED ORDER — POTASSIUM CHLORIDE 10 MEQ/100ML IV SOLN
10.0000 meq | INTRAVENOUS | Status: AC
  Administered 2021-09-17 (×2): 10 meq via INTRAVENOUS
  Filled 2021-09-16 (×2): qty 100

## 2021-09-16 MED ORDER — DEXTROSE IN LACTATED RINGERS 5 % IV SOLN: INTRAVENOUS | Status: DC

## 2021-09-16 MED ORDER — VANCOMYCIN HCL 1500 MG/300ML IV SOLN
1500.0000 mg | Freq: Once | INTRAVENOUS | Status: AC
  Administered 2021-09-16: 1500 mg via INTRAVENOUS
  Filled 2021-09-16: qty 300

## 2021-09-16 MED ORDER — LACTATED RINGERS IV SOLN: INTRAVENOUS | Status: AC

## 2021-09-16 MED ORDER — VANCOMYCIN HCL IN DEXTROSE 1-5 GM/200ML-% IV SOLN: 1000.0000 mg | INTRAVENOUS | Status: DC

## 2021-09-16 MED ORDER — LACTATED RINGERS IV BOLUS (SEPSIS)
1000.0000 mL | Freq: Once | INTRAVENOUS | Status: AC
  Administered 2021-09-17: 1000 mL via INTRAVENOUS

## 2021-09-16 NOTE — Sepsis Progress Note (Signed)
Sepsis protocol is being followed by [facility]. 

## 2021-09-16 NOTE — ED Triage Notes (Signed)
Patient BIB wife from home for evaluation of chest burning with coughing, intermittent headache, emesis, and increased weakness with abnormal gait, 14 pound weight loss over four days. Patient reports he had an elevated WBC at PCP's office this morning. History of diabetes, last A1c of 7.

## 2021-09-16 NOTE — ED Provider Triage Note (Signed)
Emergency Medicine Provider Triage Evaluation Note  Willie [name] , a 66 y.o. male  was evaluated in triage.  Pt complains of generalized fatigue, intermittent headache, nausea, vomiting, 14 pound weight loss.  Reports that he went to his PCP earlier today and had an elevated white blood cell count at 22.2.  Patient was sent to the [facility] for further evaluation.  Reports that his symptoms started on Saturday.  Also endorses cough, urinary frequency, subjective fevers, and chills.  Patient describes emesis as stomach contents.  Review of Systems  Positive: See above Negative: Constipation, diarrhea, blood in stool, melena, dysuria, hematuria  Physical Exam  BP 123/77 (BP Location: Left Arm)    Pulse (!) 106    Resp 16    SpO2 98%  Gen:   Awake, no distress, ill-appearing Resp:  Normal effort, clear to auscultation bilaterally MSK:   Moves extremities without difficulty  Other:  Abdomen soft, nondistended, nontender.  Medical Decision Making  Medically screening exam initiated at 3:29 PM.  Appropriate orders placed.  Willie [name] was informed that the remainder of the evaluation will be completed by another provider, this initial triage assessment does not replace that evaluation, and the importance of remaining in the ED until their evaluation is complete.  Due to increased pulse rate and leukocytosis concern for sepsis.  ED sepsis work-up initiated.   Loni Beckwith, [place] 09/16/21 [id number]

## 2021-09-16 NOTE — Progress Notes (Signed)
Pharmacy Antibiotic Note  Willie [name] is a 66 y.o. male admitted on 09/16/2021 with sepsis.  Pharmacy has been consulted for vancomycin and cefepime dosing.  Patient with a history of IDDM, HLD, HTN, diabetic neuropathy. Patient presenting with weakness.  SCr 1.76 - baseline unknown WBC 22.3; LA 2.2; T 97.5 F; RR 22 Flu neg COVID neg  Plan: Metronidazole per MD Cefepime 2g q12hr Vancomycin 1500 mg once then 1000 mg q24hr (eAUC 488.6) unless change in renal function Trend WBC, Fever, Renal function, & Clinical course F/u cultures, clinical course, WBC, fever De-escalate when able Levels at steady state  Weight: 74.4 kg (164 lb)  Temp (24hrs), Avg:97.8 F (36.6 C), Min:97.5 F (36.4 C), Max:98 F (36.7 C)  Recent Labs  Lab 09/16/21 1547 09/16/21 1932  WBC 22.3*  --   CREATININE 1.76*  --   LATICACIDVEN 2.2* 2.2*    CrCl cannot be calculated (Unknown ideal weight.).    No Active Allergies  Antimicrobials this admission: cefepime 2/7 >>  vancomycin 2/7 >>   Microbiology results: Pending  Thank you for allowing pharmacy to be a part of this patients care.  Lorelei Pont, PharmD, BCPS 09/16/2021 10:35 PM ED Clinical Pharmacist -  [phone]

## 2021-09-16 NOTE — ED Provider Notes (Signed)
[facility] EMERGENCY DEPARTMENT Provider Note   CSN: [id number] Arrival date & time: 09/16/21  1441     History  Chief Complaint  Patient presents with   Weakness    Willie [name] is a 66 y.o. male.  HPI Patient is a 66 year old male with a history of insulin-dependent diabetes mellitus, hyperlipidemia, hypertension, diabetic neuropathy, who presents to the emergency department due to weakness.  His wife is at bedside and provides most of the history.  She states that over the past 4 days he has become increasingly weak.  She reports intermittent nausea/vomiting as well as decreased appetite.  She reports a 14 pound weight loss.  Patient complains of mild upper abdominal pain as well as a sore throat that began after vomiting.  No chest pain or shortness of breath.  Does note increased urinary frequency but no other urinary complaints.    Home Medications Prior to Admission medications   Medication Sig Start Date End Date Taking? Authorizing Provider  aspirin 81 MG tablet Take 81 mg by mouth daily.   Yes [provider]  atorvastatin (LIPITOR) 40 MG tablet Take 40 mg by mouth daily.   Yes [provider]  DULoxetine (CYMBALTA) 60 MG capsule Take 60 mg by mouth daily. 11/10/20  Yes [provider]  empagliflozin (JARDIANCE) 25 MG TABS tablet Take 25 mg by mouth daily.    Yes [provider]  gabapentin (NEURONTIN) 300 MG capsule Take 300 mg by mouth 3 (three) times daily.   Yes [provider]  metFORMIN (GLUCOPHAGE) 1000 MG tablet Take 1,000 mg by mouth 2 (two) times daily with a meal.   Yes [provider]  Multiple Vitamins-Minerals (MULTIVITAMIN GUMMIES ADULTS PO) Take 1 tablet by mouth daily.   Yes [provider]  naproxen (NAPROSYN) 500 MG tablet Take 500 mg by mouth 2 (two) times daily as needed for mild pain.   Yes [provider]  OZEMPIC, 1 MG/DOSE, 2 MG/1.5ML SOPN Inject 1 mg into the  skin every Saturday. 12/01/19  Yes [provider]  telmisartan (MICARDIS) 40 MG tablet Take 40 mg by mouth daily.   Yes [provider]  TRESIBA FLEXTOUCH 100 UNIT/ML FlexTouch Pen Inject 40 Units into the skin daily. 08/13/19  Yes [provider]      Allergies    Patient has no known allergies.    Review of Systems   Review of Systems  All other systems reviewed and are negative. Ten systems reviewed and are negative for acute change, except as noted in the HPI.   Physical Exam Updated Vital Signs BP (!) 143/67 (BP Location: Left Arm)    Pulse (!) 113    Temp 98 F (36.7 C)    Resp (!) 22    Wt 74.4 kg    SpO2 97%    BMI 22.87 kg/m  Physical Exam Vitals and nursing note reviewed.  Constitutional:      General: He is in acute distress.     Appearance: He is ill-appearing and toxic-appearing. He is not diaphoretic.     Comments: Weak and fatigued.  Patient unable to stand without assistance.  Speaks in whispers.  HENT:     Head: Normocephalic and atraumatic.     Right Ear: External ear normal.     Left Ear: External ear normal.     Nose: Nose normal.     Mouth/Throat:     Mouth: Mucous membranes are moist.  Pharynx: Oropharynx is clear. No oropharyngeal exudate or posterior oropharyngeal erythema.  Eyes:     General: No scleral icterus.       Right eye: No discharge.        Left eye: No discharge.     Extraocular Movements: Extraocular movements intact.     Conjunctiva/sclera: Conjunctivae normal.  Cardiovascular:     Rate and Rhythm: Regular rhythm. Tachycardia present.     Pulses: Normal pulses.     Heart sounds: Normal heart sounds. No murmur heard.   No friction rub. No gallop.  Pulmonary:     Effort: Pulmonary effort is normal. No respiratory distress.     Breath sounds: Normal breath sounds. No stridor. No wheezing, rhonchi or rales.     Comments: Lungs are clear to auscultation bilaterally.  Kussmaul respirations. Abdominal:      General: Abdomen is flat.     Palpations: Abdomen is soft.     Tenderness: There is abdominal tenderness.     Comments: Protuberant abdomen that is soft.  Mild tenderness noted along the epigastrium.  Musculoskeletal:        General: Normal range of motion.     Cervical back: Normal range of motion and neck supple. No tenderness.     Right lower leg: No edema.     Left lower leg: No edema.     Comments: No pedal edema.  2+ DP/PT pulses.  Skin:    General: Skin is warm and dry.  Neurological:     General: No focal deficit present.     Mental Status: He is alert and oriented to person, place, and time.  Psychiatric:        Mood and Affect: Mood normal.        Behavior: Behavior normal.   ED Results / Procedures / Treatments   Labs (all labs ordered are listed, but only abnormal results are displayed) Labs Reviewed  LACTIC ACID, PLASMA - Abnormal; Notable for the following components:      Result Value   Lactic Acid, Venous 2.2 (*)    All other components within normal limits  LACTIC ACID, PLASMA - Abnormal; Notable for the following components:   Lactic Acid, Venous 2.2 (*)    All other components within normal limits  COMPREHENSIVE METABOLIC PANEL - Abnormal; Notable for the following components:   Sodium 132 (*)    CO2 <7 (*)    Glucose, Bld 259 (*)    BUN 36 (*)    Creatinine, Ser 1.76 (*)    Total Protein 8.7 (*)    AST 12 (*)    GFR, Estimated 42 (*)    All other components within normal limits  CBC WITH DIFFERENTIAL/PLATELET - Abnormal; Notable for the following components:   WBC 22.3 (*)    Neutro Abs 20.2 (*)    Lymphs Abs 0.4 (*)    Basophils Absolute 0.2 (*)    Abs Immature Granulocytes 0.56 (*)    All other components within normal limits  URINALYSIS, ROUTINE W REFLEX MICROSCOPIC - Abnormal; Notable for the following components:   APPearance HAZY (*)    Glucose, UA >=500 (*)    Hgb urine dipstick MODERATE (*)    Ketones, ur 80 (*)    Protein, ur 100 (*)     Bacteria, UA MANY (*)    All other components within normal limits  LIPASE, BLOOD - Abnormal; Notable for the following components:   Lipase 56 (*)    All other  components within normal limits  CBG MONITORING, ED - Abnormal; Notable for the following components:   Glucose-Capillary 349 (*)    All other components within normal limits  I-STAT VENOUS BLOOD GAS, ED - Abnormal; Notable for the following components:   pH, Ven 7.178 (*)    pCO2, Ven 15.2 (*)    pO2, Ven 75.0 (*)    Bicarbonate 5.6 (*)    TCO2 6 (*)    Acid-base deficit 20.0 (*)    Sodium 133 (*)    Hemoglobin 17.3 (*)    All other components within normal limits  TROPONIN I (HIGH SENSITIVITY) - Abnormal; Notable for the following components:   Troponin I (High Sensitivity) 23 (*)    All other components within normal limits  TROPONIN I (HIGH SENSITIVITY) - Abnormal; Notable for the following components:   Troponin I (High Sensitivity) 23 (*)    All other components within normal limits  RESP PANEL BY RT-PCR (FLU A&B, COVID) ARPGX2  CULTURE, BLOOD (ROUTINE X 2)  CULTURE, BLOOD (ROUTINE X 2)  URINE CULTURE  PROTIME-INR  APTT  BLOOD GAS, VENOUS  BETA-HYDROXYBUTYRIC ACID  I-STAT VENOUS BLOOD GAS, ED   EKG EKG Interpretation  Date/Time:  Tuesday September 16 2021 16:02:13 EST Ventricular Rate:  112 PR Interval:  140 QRS Duration: 90 QT Interval:  386 QTC Calculation: 526 R Axis:   61 Text Interpretation: Sinus tachycardia Prolonged QT Abnormal ECG No previous ECGs available No significant change since last tracing Confirmed by Wandra Arthurs [phone]) on 09/16/2021 10:25:52 PM  Radiology DG Chest 1 View  Result Date: 09/16/2021 CLINICAL DATA:  Cough.  Chest pain.  Vomiting. EXAM: CHEST  1 VIEW COMPARISON:  None. FINDINGS: The heart size and mediastinal contours are within normal limits. Both lungs are clear. The visualized skeletal structures are unremarkable. IMPRESSION: No active disease. Electronically Signed    By: Marlaine Hind M.D.   On: 09/16/2021 16:24   CT HEAD WO CONTRAST (5MM)  Result Date: 09/16/2021 CLINICAL DATA:  Weakness, headache, emesis EXAM: CT HEAD WITHOUT CONTRAST TECHNIQUE: Contiguous axial images were obtained from the base of the skull through the vertex without intravenous contrast. RADIATION DOSE REDUCTION: This exam was performed according to the departmental dose-optimization program which includes automated exposure control, adjustment of the mA and/or kV according to patient size and/or use of iterative reconstruction technique. COMPARISON:  11/09/2016 FINDINGS: Brain: No acute infarct or hemorrhage. Lateral ventricles and midline structures are unremarkable. No acute extra-axial fluid collections. No mass effect. Vascular: No hyperdense vessel or unexpected calcification. Skull: Normal. Negative for fracture or focal lesion. Sinuses/Orbits: Gas fluid levels are seen within the bilateral maxillary sinuses. Moderate mucosal thickening within the anterior ethmoid air cells. Other: None. IMPRESSION: 1. Acute bilateral maxillary sinusitis. 2. No acute intracranial process. Electronically Signed   By: Randa Ngo M.D.   On: 09/16/2021 23:10   CT CHEST ABDOMEN PELVIS WO CONTRAST  Result Date: 09/16/2021 CLINICAL DATA:  Sepsis workup with history of diabetes. EXAM: CT CHEST, ABDOMEN AND PELVIS WITHOUT CONTRAST TECHNIQUE: Multidetector CT imaging of the chest, abdomen and pelvis was performed following the standard protocol without IV contrast. RADIATION DOSE REDUCTION: This exam was performed according to the departmental dose-optimization program which includes automated exposure control, adjustment of the mA and/or kV according to patient size and/or use of iterative reconstruction technique. COMPARISON:  Portable chest today, CT abdomen and pelvis no contrast 05/27/2011. FINDINGS: CT CHEST FINDINGS Factors affecting image quality: Abundant patient motion mostly  due to breathing limits both the  chest and abdomen CT evaluation. Cardiovascular: The cardiac size is normal. There is no pericardial effusion. There is patchy calcification in the LAD and right coronary arteries. There is aortic calcific atherosclerosis in the arch and distal descending segment. The aorta is normal in caliber and course with scattered calcification in the great vessels. The pulmonary arteries and veins are normal caliber. Mediastinum/Nodes: Thyroid gland, axillary spaces are unremarkable. There are borderline sized precarinal, subcarinal and left hilar nodes up 9 mm in short axis but no enlarged nodes by size criteria. Trachea is unremarkable, as far as visualized. There is a patulous esophagus with retained or refluxed fluid in the upper to midportion, small hiatal hernia and mild circumferential thickening in the distal esophagus. Lungs/Pleura: There is patchy dense consolidation in the left lower lobe basal segments and some images suggest second and third order endobronchial debris or fluid within the consolidated portions. On the right, there is patchy ground-glass consolidation in the upper lobe, greatest in the posterior segment, scattered ground-glass opacities in the lower lobe. The left upper and right middle lobes are clear. There is no pleural effusion, thickening or pneumothorax. Musculoskeletal: Slight thoracic dextroscoliosis. There is degenerative disc disease and spondylosis of the thoracic spine. Mild osteopenia. No spinal compression fracture is seen. No worrisome regional bone lesion. Ribcage is intact. CT ABDOMEN PELVIS FINDINGS Hepatobiliary: The liver not well seen due to breathing motion. It appears to be mildly steatotic and measures approximately 18 cm length. There is no obvious mass. The gallbladder and bile ducts show no obvious abnormality. Pancreas: Also not well seen. No obvious focal abnormality or adjacent edema. Spleen: No focal abnormality or splenomegaly. Adrenals/Urinary Tract: There is no  adrenal mass. There is a 4 mm nonobstructive caliceal stone in the superior pole of the left kidney and a few punctate nonobstructing caliceal stones in the left lower pole. No right intrarenal stone is seen and no obstructing stones or hydronephrosis either side. In the left lower pole there is a 3.2 cm cyst of 18.2 Hounsfield units which was previously 2.6 cm. There is increased bilateral perinephric stranding which could be chronic senescent change or inflammatory. The bladder is moderately distended but has a normal wall thickness. Stomach/Bowel: The stomach moderately distended with fluid. No obvious outlet obstructing mass is seen or duodenal dilatation. There is slight dilatation and thickening of jejunal segments up to 2.7 cm in diameter. The remainder of the small bowel is normal caliber. The appendix is normal. There is moderate stool retention ascending and transverse colon. No evidence of acute colitis or diverticulitis is seen through the breathing motion. Vascular/Lymphatic: Moderate aortoiliac atherosclerosis without AAA. No enlarged lymph nodes are seen. Reproductive: The prostate has enlarged compared to prior study now measuring 4.8 cm previously 4.6 cm. Calcifications are again noted in the right lobe. Other: No incarcerated hernia is seen and no free air, hemorrhage or fluid, or acute inflammatory changes are seen. Musculoskeletal: There is osteopenia and degenerative changes of the lumbar spine. Stable 1.1 cm sclerotic lesion in the posterior right ilium presumably bone island. Bilateral mild-to-moderate hip DJD is seen as well. Degenerative lumbar foraminal stenosis L4-5 and more so L5-S1. IMPRESSION: 1. Opacities in the right upper and lower left lower lobes , most likely due to multilobar pneumonia. There could be an aspiration component to the left lower lobe infiltrates. Follow-up study recommended after treatment to ensure clearing. 2. Borderline sized lymph nodes in the mediastinum, left  hilum. No  encasing or bulky adenopathy or enlarged lymph nodes by strict size criteria. 3. Small hiatal hernia with a patulous esophagus with fluid retention or reflux in the thoracic esophagus and wall thickening in the distal esophagus. Consider endoscopic follow-up if clinically warranted. 4. Coronary artery and aortic atherosclerosis.  No AAA. 5. Fluid dilatation of the stomach, without duodenal dilatation, could be due to a recent ingestion or impaired gastric emptying. 6. Slightly prominent thickened jejunal segments. Remaining small bowel is normal caliber. Findings could be due to a low-grade jejunal partial obstruction or ileus/enteritis. 7. Constipation and diverticulosis. 8. Fatty liver. 9. Nonobstructive nephrolithiasis on the left. No obstructing stone is seen. Increased perinephric stranding could be chronic change or inflammatory. 10. Prostatomegaly. 11. Osteopenia and degenerative change. Electronically Signed   By: Telford Nab M.D.   On: 09/16/2021 23:29    Procedures .Critical Care Performed by: Rayna Sexton, PA-C Authorized by: Rayna Sexton, PA-C   Critical care provider statement:    Critical care time (minutes):  30   Critical care was necessary to treat or prevent imminent or life-threatening deterioration of the following conditions:  Sepsis   Critical care was time spent personally by me on the following activities:  Development of treatment plan with patient or surrogate, discussions with consultants, evaluation of patient's response to treatment, examination of patient, ordering and review of laboratory studies, ordering and review of radiographic studies, ordering and performing treatments and interventions, pulse oximetry, re-evaluation of patient's condition and review of old charts   Medications Ordered in ED Medications  lactated ringers infusion (has no administration in time range)  lactated ringers bolus 1,000 mL (has no administration in time range)     And  lactated ringers bolus 1,000 mL (1,000 mLs Intravenous New Bag/Given 09/16/21 2318)    And  lactated ringers bolus 250 mL (has no administration in time range)  ceFEPIme (MAXIPIME) 2 g in sodium chloride 0.9 % 100 mL IVPB (2 g Intravenous New Bag/Given 09/16/21 2322)  metroNIDAZOLE (FLAGYL) IVPB 500 mg (500 mg Intravenous New Bag/Given 09/16/21 2323)  vancomycin (VANCOREADY) IVPB 1500 mg/300 mL (1,500 mg Intravenous New Bag/Given 09/16/21 2320)  ceFEPIme (MAXIPIME) 2 g in sodium chloride 0.9 % 100 mL IVPB (has no administration in time range)  vancomycin (VANCOCIN) IVPB 1000 mg/200 mL premix (has no administration in time range)  insulin regular, human (MYXREDLIN) 100 units/ 100 mL infusion (has no administration in time range)  dextrose 5 % in lactated ringers infusion (has no administration in time range)  dextrose 50 % solution 0-50 mL (has no administration in time range)  potassium chloride 10 mEq in 100 mL IVPB (has no administration in time range)  ondansetron (ZOFRAN) injection 4 mg (4 mg Intravenous Given 09/16/21 2309)  sodium bicarbonate injection 50 mEq (50 mEq Intravenous Given 09/16/21 2309)   ED Course/ Medical Decision Making/ A&P                           Medical Decision Making Amount and/or Complexity of Data Reviewed Labs: ordered. Radiology: ordered.  Risk Prescription drug management. Decision regarding hospitalization.  Pt is a 66 y.o. male who presents to the emergency department due to weakness, nausea, vomiting, decreased appetite.  Patient was found to have a significant leukocytosis at his primary care provider this morning and was sent to the emergency department for further evaluation.  Labs: CBC with leukocytosis of 22.3, neutrophilia of 20.2, lymphocytes of 0.4, basophils of 0.2,  absolute immature granulocytes 0.56. Respiratory panel is negative. Blood cultures obtained. CMP with a sodium of 132, CO2 less than 7, glucose of 259, BUN of 36, creatinine of  1.76, total protein of 8.7, AST of 12, GFR of 42. Lactic acid of 2.2. Troponin is 23. UA with greater than 500 glucose, moderate hemoglobin, 80 ketones, 100 protein, 6-10 white blood cells, many bacteria. VBG with a pH of 7.178 and a PCO2 of 15.2. Lipase 56.  Imaging: Chest x-ray shows no active disease. CT scan of the head without contrast shows acute bilateral maxillary sinusitis.  No acute intracranial process. CT scan of the chest, abdomen, and pelvis without contrast shows IMPRESSION: 1. Opacities in the right upper and lower left lower lobes , most likely due to multilobar pneumonia. There could be an aspiration component to the left lower lobe infiltrates. Follow-up study recommended after treatment to ensure clearing. 2. Borderline sized lymph nodes in the mediastinum, left hilum. No encasing or bulky adenopathy or enlarged lymph nodes by strict size criteria. 3. Small hiatal hernia with a patulous esophagus with fluid retention or reflux in the thoracic esophagus and wall thickening in the distal esophagus. Consider endoscopic follow-up if clinically warranted. 4. Coronary artery and aortic atherosclerosis.  No AAA. 5. Fluid dilatation of the stomach, without duodenal dilatation, could be due to a recent ingestion or impaired gastric emptying. 6. Slightly prominent thickened jejunal segments. Remaining small bowel is normal caliber. Findings could be due to a low-grade jejunal partial obstruction or ileus/enteritis. 7. Constipation and diverticulosis. 8. Fatty liver. 9. Nonobstructive nephrolithiasis on the left. No obstructing stone is seen. Increased perinephric stranding could be chronic change or inflammatory. 10. Prostatomegaly. 11. Osteopenia and degenerative change.   I, Rayna Sexton, PA-C, personally reviewed and evaluated these images and lab results as part of my medical decision-making.  Patient's symptoms appear to be multifactorial.  Profound metabolic acidosis with a CO2 less  than 7.  pH of 7.178.  CT scans concerning for multilobar pneumonia.  UA also concerning for possible UTI with many bacteria, though there are no nitrites or leukocytes.  Patient also hyperglycemic around 349.  There are 80 urine ketones.  Given patient's CO2, ketonuria, possible concomitant DKA.  Given patient's presentation he was started on a 30 cc/kg bolus of IV fluids.  Broad-spectrum antibiotics.  Patient was then started on Endo tool and given 1 amp of bicarb.  Patient will require admission for further management.  This was discussed with the patient as well as his wife who are amenable.  Note: Portions of this report may have been transcribed using voice recognition software. Every effort was made to ensure accuracy; however, inadvertent computerized transcription errors may be present.   Final Clinical Impression(s) / ED Diagnoses Final diagnoses:  Sepsis, due to unspecified organism, unspecified whether acute organ dysfunction present [facility])  Multifocal pneumonia  Hyperglycemia  Metabolic acidosis   Rx / DC Orders ED Discharge Orders     None         Rayna Sexton, PA-C 09/16/21 2344    Drenda Freeze, MD 09/18/21 [phone]

## 2021-09-17 ENCOUNTER — Other Ambulatory Visit: Payer: Self-pay

## 2021-09-17 DIAGNOSIS — R197 Diarrhea, unspecified: Secondary | ICD-10-CM | POA: Diagnosis not present

## 2021-09-17 DIAGNOSIS — E785 Hyperlipidemia, unspecified: Secondary | ICD-10-CM | POA: Diagnosis present

## 2021-09-17 DIAGNOSIS — E1140 Type 2 diabetes mellitus with diabetic neuropathy, unspecified: Secondary | ICD-10-CM | POA: Diagnosis not present

## 2021-09-17 DIAGNOSIS — E876 Hypokalemia: Secondary | ICD-10-CM | POA: Diagnosis present

## 2021-09-17 DIAGNOSIS — Z8249 Family history of ischemic heart disease and other diseases of the circulatory system: Secondary | ICD-10-CM | POA: Diagnosis not present

## 2021-09-17 DIAGNOSIS — I4891 Unspecified atrial fibrillation: Secondary | ICD-10-CM | POA: Diagnosis present

## 2021-09-17 DIAGNOSIS — E782 Mixed hyperlipidemia: Secondary | ICD-10-CM

## 2021-09-17 DIAGNOSIS — R0902 Hypoxemia: Secondary | ICD-10-CM | POA: Diagnosis present

## 2021-09-17 DIAGNOSIS — H9191 Unspecified hearing loss, right ear: Secondary | ICD-10-CM | POA: Diagnosis present

## 2021-09-17 DIAGNOSIS — K567 Ileus, unspecified: Secondary | ICD-10-CM | POA: Diagnosis present

## 2021-09-17 DIAGNOSIS — R45851 Suicidal ideations: Secondary | ICD-10-CM | POA: Diagnosis present

## 2021-09-17 DIAGNOSIS — I10 Essential (primary) hypertension: Secondary | ICD-10-CM | POA: Diagnosis present

## 2021-09-17 DIAGNOSIS — K3184 Gastroparesis: Secondary | ICD-10-CM | POA: Diagnosis not present

## 2021-09-17 DIAGNOSIS — R159 Full incontinence of feces: Secondary | ICD-10-CM | POA: Diagnosis present

## 2021-09-17 DIAGNOSIS — E1142 Type 2 diabetes mellitus with diabetic polyneuropathy: Secondary | ICD-10-CM | POA: Diagnosis present

## 2021-09-17 DIAGNOSIS — E1110 Type 2 diabetes mellitus with ketoacidosis without coma: Secondary | ICD-10-CM

## 2021-09-17 DIAGNOSIS — Z8614 Personal history of Methicillin resistant Staphylococcus aureus infection: Secondary | ICD-10-CM | POA: Diagnosis not present

## 2021-09-17 DIAGNOSIS — A419 Sepsis, unspecified organism: Principal | ICD-10-CM

## 2021-09-17 DIAGNOSIS — R739 Hyperglycemia, unspecified: Secondary | ICD-10-CM

## 2021-09-17 DIAGNOSIS — K449 Diaphragmatic hernia without obstruction or gangrene: Secondary | ICD-10-CM | POA: Diagnosis present

## 2021-09-17 DIAGNOSIS — B9710 Unspecified enterovirus as the cause of diseases classified elsewhere: Secondary | ICD-10-CM | POA: Diagnosis present

## 2021-09-17 DIAGNOSIS — N390 Urinary tract infection, site not specified: Secondary | ICD-10-CM | POA: Diagnosis present

## 2021-09-17 DIAGNOSIS — I700 Atherosclerosis of aorta: Secondary | ICD-10-CM | POA: Diagnosis present

## 2021-09-17 DIAGNOSIS — R531 Weakness: Secondary | ICD-10-CM | POA: Diagnosis present

## 2021-09-17 DIAGNOSIS — Z833 Family history of diabetes mellitus: Secondary | ICD-10-CM | POA: Diagnosis not present

## 2021-09-17 DIAGNOSIS — K2100 Gastro-esophageal reflux disease with esophagitis, without bleeding: Secondary | ICD-10-CM | POA: Diagnosis not present

## 2021-09-17 DIAGNOSIS — J189 Pneumonia, unspecified organism: Secondary | ICD-10-CM

## 2021-09-17 DIAGNOSIS — E1149 Type 2 diabetes mellitus with other diabetic neurological complication: Secondary | ICD-10-CM

## 2021-09-17 DIAGNOSIS — B9789 Other viral agents as the cause of diseases classified elsewhere: Secondary | ICD-10-CM | POA: Diagnosis present

## 2021-09-17 DIAGNOSIS — J13 Pneumonia due to Streptococcus pneumoniae: Secondary | ICD-10-CM | POA: Diagnosis present

## 2021-09-17 DIAGNOSIS — R1013 Epigastric pain: Secondary | ICD-10-CM | POA: Diagnosis not present

## 2021-09-17 DIAGNOSIS — R6520 Severe sepsis without septic shock: Secondary | ICD-10-CM

## 2021-09-17 DIAGNOSIS — Z87891 Personal history of nicotine dependence: Secondary | ICD-10-CM | POA: Diagnosis not present

## 2021-09-17 DIAGNOSIS — Z20822 Contact with and (suspected) exposure to covid-19: Secondary | ICD-10-CM | POA: Diagnosis present

## 2021-09-17 DIAGNOSIS — E872 Acidosis, unspecified: Secondary | ICD-10-CM

## 2021-09-17 LAB — BASIC METABOLIC PANEL
Anion gap: 19 — ABNORMAL HIGH (ref 5–15)
Anion gap: 13 (ref 5–15)
Anion gap: 12 (ref 5–15)
Anion gap: 9 (ref 5–15)
Anion gap: 11 (ref 5–15)
Anion gap: 13 (ref 5–15)
BUN: 38 mg/dL — ABNORMAL HIGH (ref 8–23)
BUN: 28 mg/dL — ABNORMAL HIGH (ref 8–23)
BUN: 28 mg/dL — ABNORMAL HIGH (ref 8–23)
BUN: 25 mg/dL — ABNORMAL HIGH (ref 8–23)
BUN: 21 mg/dL (ref 8–23)
BUN: 20 mg/dL (ref 8–23)
CO2: 10 mmol/L — ABNORMAL LOW (ref 22–32)
CO2: 13 mmol/L — ABNORMAL LOW (ref 22–32)
CO2: 15 mmol/L — ABNORMAL LOW (ref 22–32)
CO2: 15 mmol/L — ABNORMAL LOW (ref 22–32)
CO2: 14 mmol/L — ABNORMAL LOW (ref 22–32)
CO2: 14 mmol/L — ABNORMAL LOW (ref 22–32)
Calcium: 9 mg/dL (ref 8.9–10.3)
Calcium: 9.2 mg/dL (ref 8.9–10.3)
Calcium: 9.4 mg/dL (ref 8.9–10.3)
Calcium: 9.3 mg/dL (ref 8.9–10.3)
Calcium: 9.2 mg/dL (ref 8.9–10.3)
Calcium: 9.3 mg/dL (ref 8.9–10.3)
Chloride: 106 mmol/L (ref 98–111)
Chloride: 111 mmol/L (ref 98–111)
Chloride: 111 mmol/L (ref 98–111)
Chloride: 113 mmol/L — ABNORMAL HIGH (ref 98–111)
Chloride: 111 mmol/L (ref 98–111)
Chloride: 107 mmol/L (ref 98–111)
Creatinine, Ser: 1.31 mg/dL — ABNORMAL HIGH (ref 0.61–1.24)
Creatinine, Ser: 0.99 mg/dL (ref 0.61–1.24)
Creatinine, Ser: 1 mg/dL (ref 0.61–1.24)
Creatinine, Ser: 0.86 mg/dL (ref 0.61–1.24)
Creatinine, Ser: 0.88 mg/dL (ref 0.61–1.24)
Creatinine, Ser: 0.89 mg/dL (ref 0.61–1.24)
GFR, Estimated: 60 mL/min — ABNORMAL LOW (ref >60)
GFR, Estimated: >60 mL/min (ref >60)
GFR, Estimated: >60 mL/min (ref >60)
GFR, Estimated: >60 mL/min (ref >60)
GFR, Estimated: >60 mL/min (ref >60)
GFR, Estimated: >60 mL/min (ref >60)
Glucose, Bld: 176 mg/dL — ABNORMAL HIGH (ref 70–99)
Glucose, Bld: 139 mg/dL — ABNORMAL HIGH (ref 70–99)
Glucose, Bld: 139 mg/dL — ABNORMAL HIGH (ref 70–99)
Glucose, Bld: 123 mg/dL — ABNORMAL HIGH (ref 70–99)
Glucose, Bld: 128 mg/dL — ABNORMAL HIGH (ref 70–99)
Glucose, Bld: 154 mg/dL — ABNORMAL HIGH (ref 70–99)
Potassium: 4 mmol/L (ref 3.5–5.1)
Potassium: 3.6 mmol/L (ref 3.5–5.1)
Potassium: 3.6 mmol/L (ref 3.5–5.1)
Potassium: 4.7 mmol/L (ref 3.5–5.1)
Potassium: 4 mmol/L (ref 3.5–5.1)
Potassium: 4 mmol/L (ref 3.5–5.1)
Sodium: 135 mmol/L (ref 135–145)
Sodium: 137 mmol/L (ref 135–145)
Sodium: 138 mmol/L (ref 135–145)
Sodium: 137 mmol/L (ref 135–145)
Sodium: 136 mmol/L (ref 135–145)
Sodium: 134 mmol/L — ABNORMAL LOW (ref 135–145)

## 2021-09-17 LAB — CBG MONITORING, ED
Glucose-Capillary: 101 mg/dL — ABNORMAL HIGH (ref 70–99)
Glucose-Capillary: 140 mg/dL — ABNORMAL HIGH (ref 70–99)
Glucose-Capillary: 143 mg/dL — ABNORMAL HIGH (ref 70–99)
Glucose-Capillary: 145 mg/dL — ABNORMAL HIGH (ref 70–99)
Glucose-Capillary: 146 mg/dL — ABNORMAL HIGH (ref 70–99)
Glucose-Capillary: 149 mg/dL — ABNORMAL HIGH (ref 70–99)
Glucose-Capillary: 150 mg/dL — ABNORMAL HIGH (ref 70–99)
Glucose-Capillary: 152 mg/dL — ABNORMAL HIGH (ref 70–99)
Glucose-Capillary: 153 mg/dL — ABNORMAL HIGH (ref 70–99)
Glucose-Capillary: 153 mg/dL — ABNORMAL HIGH (ref 70–99)
Glucose-Capillary: 158 mg/dL — ABNORMAL HIGH (ref 70–99)
Glucose-Capillary: 159 mg/dL — ABNORMAL HIGH (ref 70–99)
Glucose-Capillary: 163 mg/dL — ABNORMAL HIGH (ref 70–99)
Glucose-Capillary: 167 mg/dL — ABNORMAL HIGH (ref 70–99)
Glucose-Capillary: 169 mg/dL — ABNORMAL HIGH (ref 70–99)
Glucose-Capillary: 177 mg/dL — ABNORMAL HIGH (ref 70–99)
Glucose-Capillary: 199 mg/dL — ABNORMAL HIGH (ref 70–99)
Glucose-Capillary: 215 mg/dL — ABNORMAL HIGH (ref 70–99)
Glucose-Capillary: 240 mg/dL — ABNORMAL HIGH (ref 70–99)
Glucose-Capillary: 289 mg/dL — ABNORMAL HIGH (ref 70–99)
Glucose-Capillary: 311 mg/dL — ABNORMAL HIGH (ref 70–99)

## 2021-09-17 LAB — I-STAT VENOUS BLOOD GAS, ED
Acid-base deficit: 15 mmol/L — ABNORMAL HIGH (ref 0.0–2.0)
Acid-base deficit: 6 mmol/L — ABNORMAL HIGH (ref 0.0–2.0)
Bicarbonate: 9.6 mmol/L — ABNORMAL LOW (ref 20.0–28.0)
Bicarbonate: 17.2 mmol/L — ABNORMAL LOW (ref 20.0–28.0)
Calcium, Ion: 1.18 mmol/L (ref 1.15–1.40)
Calcium, Ion: 1.27 mmol/L (ref 1.15–1.40)
HCT: 43 % (ref 39.0–52.0)
HCT: 40 % (ref 39.0–52.0)
Hemoglobin: 14.6 g/dL (ref 13.0–17.0)
Hemoglobin: 13.6 g/dL (ref 13.0–17.0)
O2 Saturation: 90 %
O2 Saturation: 100 %
Potassium: 3.9 mmol/L (ref 3.5–5.1)
Potassium: 4.1 mmol/L (ref 3.5–5.1)
Sodium: 137 mmol/L (ref 135–145)
Sodium: 139 mmol/L (ref 135–145)
TCO2: 10 mmol/L — ABNORMAL LOW (ref 22–32)
TCO2: 18 mmol/L — ABNORMAL LOW (ref 22–32)
pCO2, Ven: 20.7 mmHg — ABNORMAL LOW (ref 44.0–60.0)
pCO2, Ven: 28 mmHg — ABNORMAL LOW (ref 44.0–60.0)
pH, Ven: 7.274 (ref 7.250–7.430)
pH, Ven: 7.396 (ref 7.250–7.430)
pO2, Ven: 63 mmHg — ABNORMAL HIGH (ref 32.0–45.0)
pO2, Ven: 177 mmHg — ABNORMAL HIGH (ref 32.0–45.0)

## 2021-09-17 LAB — CBC WITH DIFFERENTIAL/PLATELET
Abs Immature Granulocytes: 0.24 10*3/uL — ABNORMAL HIGH (ref 0.00–0.07)
Basophils Absolute: 0 10*3/uL (ref 0.0–0.1)
Basophils Relative: 0 %
Eosinophils Absolute: 0.3 10*3/uL (ref 0.0–0.5)
Eosinophils Relative: 2 %
HCT: 44.8 % (ref 39.0–52.0)
Hemoglobin: 14.5 g/dL (ref 13.0–17.0)
Immature Granulocytes: 1 %
Lymphocytes Relative: 2 %
Lymphs Abs: 0.4 10*3/uL — ABNORMAL LOW (ref 0.7–4.0)
MCH: 30 pg (ref 26.0–34.0)
MCHC: 32.4 g/dL (ref 30.0–36.0)
MCV: 92.8 fL (ref 80.0–100.0)
Monocytes Absolute: 0.7 10*3/uL (ref 0.1–1.0)
Monocytes Relative: 4 %
Neutro Abs: 15.4 10*3/uL — ABNORMAL HIGH (ref 1.7–7.7)
Neutrophils Relative %: 91 %
Platelets: 294 10*3/uL (ref 150–400)
RBC: 4.83 MIL/uL (ref 4.22–5.81)
RDW: 12.8 % (ref 11.5–15.5)
Smear Review: NORMAL
WBC: 16.9 10*3/uL — ABNORMAL HIGH (ref 4.0–10.5)
nRBC: 0 % (ref 0.0–0.2)

## 2021-09-17 LAB — RESPIRATORY PANEL BY PCR

## 2021-09-17 LAB — SODIUM, URINE, RANDOM: Sodium, Ur: 21 mmol/L

## 2021-09-17 LAB — CK: Total CK: 59 U/L (ref 49–397)

## 2021-09-17 LAB — HEPATIC FUNCTION PANEL
ALT: 13 U/L (ref 0–44)
AST: 14 U/L — ABNORMAL LOW (ref 15–41)
Albumin: 2.9 g/dL — ABNORMAL LOW (ref 3.5–5.0)
Alkaline Phosphatase: 95 U/L (ref 38–126)
Bilirubin, Direct: 0.2 mg/dL (ref 0.0–0.2)
Indirect Bilirubin: 1.4 mg/dL — ABNORMAL HIGH (ref 0.3–0.9)
Total Bilirubin: 1.6 mg/dL — ABNORMAL HIGH (ref 0.3–1.2)
Total Protein: 6.7 g/dL (ref 6.5–8.1)

## 2021-09-17 LAB — OSMOLALITY, URINE: Osmolality, Ur: 614 mOsm/kg (ref 300–900)

## 2021-09-17 LAB — CREATININE, URINE, RANDOM: Creatinine, Urine: 44.14 mg/dL

## 2021-09-17 LAB — BETA-HYDROXYBUTYRIC ACID
Beta-Hydroxybutyric Acid: 4.33 mmol/L — ABNORMAL HIGH (ref 0.05–0.27)
Beta-Hydroxybutyric Acid: 1.33 mmol/L — ABNORMAL HIGH (ref 0.05–0.27)
Beta-Hydroxybutyric Acid: 2.09 mmol/L — ABNORMAL HIGH (ref 0.05–0.27)

## 2021-09-17 LAB — LACTIC ACID, PLASMA
Lactic Acid, Venous: 1.6 mmol/L (ref 0.5–1.9)
Lactic Acid, Venous: 1.3 mmol/L (ref 0.5–1.9)

## 2021-09-17 LAB — STREP PNEUMONIAE URINARY ANTIGEN: Strep Pneumo Urinary Antigen: POSITIVE — AB

## 2021-09-17 LAB — PROCALCITONIN: Procalcitonin: 2.79 ng/mL

## 2021-09-17 LAB — HIV ANTIBODY (ROUTINE TESTING W REFLEX): HIV Screen 4th Generation wRfx: NONREACTIVE

## 2021-09-17 LAB — HEMOGLOBIN A1C
Hgb A1c MFr Bld: 7.7 % — ABNORMAL HIGH (ref 4.8–5.6)
Mean Plasma Glucose: 174.29 mg/dL

## 2021-09-17 LAB — PHOSPHORUS: Phosphorus: 3.4 mg/dL (ref 2.5–4.6)

## 2021-09-17 LAB — TSH: TSH: 0.451 u[IU]/mL (ref 0.350–4.500)

## 2021-09-17 LAB — MAGNESIUM: Magnesium: 2.8 mg/dL — ABNORMAL HIGH (ref 1.7–2.4)

## 2021-09-17 LAB — OSMOLALITY: Osmolality: 321 mOsm/kg (ref 275–295)

## 2021-09-17 MED ORDER — THIAMINE HCL 100 MG/ML IJ SOLN
100.0000 mg | Freq: Once | INTRAMUSCULAR | Status: AC
  Administered 2021-09-17: 100 mg via INTRAVENOUS
  Filled 2021-09-17: qty 2

## 2021-09-17 MED ORDER — DILTIAZEM LOAD VIA INFUSION: 15.0000 mg | Freq: Once | INTRAVENOUS | Status: DC

## 2021-09-17 MED ORDER — DILTIAZEM LOAD VIA INFUSION
10.0000 mg | Freq: Once | INTRAVENOUS | Status: AC
  Administered 2021-09-17: 10 mg via INTRAVENOUS
  Filled 2021-09-17: qty 10

## 2021-09-17 MED ORDER — GABAPENTIN 300 MG PO CAPS
300.0000 mg | ORAL_CAPSULE | Freq: Three times a day (TID) | ORAL | Status: DC
  Administered 2021-09-17 – 2021-09-21 (×12): 300 mg via ORAL
  Filled 2021-09-17 (×12): qty 1

## 2021-09-17 MED ORDER — DULOXETINE HCL 60 MG PO CPEP
60.0000 mg | ORAL_CAPSULE | Freq: Every day | ORAL | Status: DC
Start: 2021-09-17 — End: 2021-09-21
  Administered 2021-09-17 – 2021-09-21 (×5): 60 mg via ORAL
  Filled 2021-09-17 (×5): qty 1

## 2021-09-17 MED ORDER — DEXTROSE IN LACTATED RINGERS 5 % IV SOLN: INTRAVENOUS | Status: DC

## 2021-09-17 MED ORDER — SODIUM CHLORIDE 0.9 % IV SOLN
2.0000 g | INTRAVENOUS | Status: DC
  Administered 2021-09-17 – 2021-09-19 (×3): 2 g via INTRAVENOUS
  Filled 2021-09-17 (×3): qty 20

## 2021-09-17 MED ORDER — DILTIAZEM HCL-DEXTROSE 125-5 MG/125ML-% IV SOLN (PREMIX)
5.0000 mg/h | INTRAVENOUS | Status: DC
  Administered 2021-09-17: 5 mg/h via INTRAVENOUS

## 2021-09-17 MED ORDER — METOPROLOL TARTRATE 5 MG/5ML IV SOLN
5.0000 mg | INTRAVENOUS | Status: DC | PRN
  Administered 2021-09-17 – 2021-09-19 (×2): 5 mg via INTRAVENOUS
  Filled 2021-09-17 (×2): qty 5

## 2021-09-17 MED ORDER — DILTIAZEM HCL-DEXTROSE 125-5 MG/125ML-% IV SOLN (PREMIX)
5.0000 mg/h | INTRAVENOUS | Status: DC
Start: 2021-09-17 — End: 2021-09-17

## 2021-09-17 MED ORDER — LACTATED RINGERS IV SOLN: INTRAVENOUS | Status: DC

## 2021-09-17 MED ORDER — APIXABAN 5 MG PO TABS
5.0000 mg | ORAL_TABLET | Freq: Two times a day (BID) | ORAL | Status: DC
  Administered 2021-09-18 – 2021-09-21 (×7): 5 mg via ORAL
  Filled 2021-09-17 (×7): qty 1

## 2021-09-17 MED ORDER — HEPARIN SODIUM (PORCINE) 5000 UNIT/ML IJ SOLN
5000.0000 [IU] | Freq: Three times a day (TID) | INTRAMUSCULAR | Status: DC
  Administered 2021-09-17 (×2): 5000 [IU] via SUBCUTANEOUS
  Filled 2021-09-17 (×2): qty 1

## 2021-09-17 MED ORDER — ATORVASTATIN CALCIUM 40 MG PO TABS
40.0000 mg | ORAL_TABLET | Freq: Every day | ORAL | Status: DC
Start: 2021-09-17 — End: 2021-09-21
  Administered 2021-09-17 – 2021-09-21 (×5): 40 mg via ORAL
  Filled 2021-09-17 (×5): qty 1

## 2021-09-17 MED ORDER — ASPIRIN EC 81 MG PO TBEC
81.0000 mg | DELAYED_RELEASE_TABLET | Freq: Every day | ORAL | Status: DC
  Administered 2021-09-17 – 2021-09-21 (×5): 81 mg via ORAL
  Filled 2021-09-17 (×5): qty 1

## 2021-09-17 MED ORDER — VANCOMYCIN HCL IN DEXTROSE 1-5 GM/200ML-% IV SOLN
1000.0000 mg | Freq: Two times a day (BID) | INTRAVENOUS | Status: DC
  Administered 2021-09-17: 1000 mg via INTRAVENOUS
  Filled 2021-09-17: qty 200

## 2021-09-17 MED ORDER — SODIUM CHLORIDE 0.9 % IV SOLN
2.0000 g | Freq: Three times a day (TID) | INTRAVENOUS | Status: DC
  Administered 2021-09-17: 2 g via INTRAVENOUS
  Filled 2021-09-17: qty 2

## 2021-09-17 MED ORDER — INSULIN REGULAR(HUMAN) IN NACL 100-0.9 UT/100ML-% IV SOLN
INTRAVENOUS | Status: DC
  Administered 2021-09-17: 5.5 [IU]/h via INTRAVENOUS
  Administered 2021-09-18 (×2): 1.6 [IU]/h via INTRAVENOUS
  Administered 2021-09-18: 1.1 [IU]/h via INTRAVENOUS
  Filled 2021-09-17: qty 100

## 2021-09-17 NOTE — Assessment & Plan Note (Signed)
Allow permissive htn ?

## 2021-09-17 NOTE — Progress Notes (Signed)
Pharmacy Antibiotic Note  Willie [name] is a 66 y.o. male admitted on 09/16/2021 with sepsis. Pharmacy has been consulted for vancomycin and cefepime dosing. Pt with low grade fever of 100.3 and WBC is elevated at 16.9. PCT is elevated. Scr and lactic acid have normalized.   Plan: Change cefepime to 2gm IV Q8H Change vancomycin to 1g IV Q12H F/u renal fxn, C&S, clinical status and peak/trough at SS  Height: 5\' 11"  (180.3 cm) Weight: 74.4 kg (164 lb) IBW/kg (Calculated) : 75.3  Temp (24hrs), Avg:98.4 F (36.9 C), Min:97.5 F (36.4 C), Max:100.3 F (37.9 C)  Recent Labs  Lab 09/16/21 1547 09/16/21 1932 09/17/21 0105 09/17/21 0339 09/17/21 0750  WBC 22.3*  --  16.9*  --   --   CREATININE 1.76*  --  1.58* 1.31* 0.99  LATICACIDVEN 2.2* 2.2* 1.6 1.3  --      Estimated Creatinine Clearance: 76.2 mL/min (by C-G formula based on SCr of 0.99 mg/dL).    No Known Allergies  Antimicrobials this admission:  cefepime 2/7 >>  vancomycin 2/7 >>  Metronidazole x 1 2/7  Microbiology results: 2/7 Blood - NGTD Flu neg COVID neg 2/7 RVP - rhinovirus/enterovirus+  Thank you for allowing pharmacy to be a part of this patients care.  Salome Arnt, PharmD, BCPS Clinical Pharmacist Please see AMION for all pharmacy numbers 09/17/2021 9:50 AM

## 2021-09-17 NOTE — Progress Notes (Signed)
Patient was seen and examined in his visit.  He was admitted for DKA type II.  Associated with anion gap metabolic acidosis.  He is currently on DKA protocol, insulin drip and IV fluid hydration.  BMP every 4 hours.  Beta hydroxybutyrate acid is downtrending.  We will transition to subcu insulin when indicated.  Please refer to H&P dictated by my partner Dr. Roel Cluck on 09/17/2021 for further details of the assessment and plan.

## 2021-09-17 NOTE — Assessment & Plan Note (Signed)
-   Continue home meds °

## 2021-09-17 NOTE — Progress Notes (Signed)
ANTICOAGULATION CONSULT NOTE - Initial Consult  Pharmacy Consult for Apixaban  Indication: atrial fibrillation  No Known Allergies  Patient Measurements: Height: 5\' 11"  (180.3 cm) Weight: 74.4 kg (164 lb) IBW/kg (Calculated) : 75.3  Vital Signs: Temp: 99.6 F (37.6 C) (02/08 2331) Temp Source: Rectal (02/08 2331) BP: 140/87 (02/08 2300) Pulse Rate: 154 (02/08 2315)  Labs: Recent Labs    09/16/21 1547 09/16/21 1932 09/16/21 2237 09/17/21 0105 09/17/21 0339 09/17/21 0357 09/17/21 0750 09/17/21 0937 09/17/21 1304 09/17/21 1847 09/17/21 1903  HGB 17.0  --    < > 14.5  --  14.6  --   --   --   --  13.6  HCT 51.2  --    < > 44.8  --  43.0  --   --   --   --  40.0  PLT 397  --   --  294  --   --   --   --   --   --   --   APTT 33  --   --   --   --   --   --   --   --   --   --   LABPROT 14.7  --   --   --   --   --   --   --   --   --   --   INR 1.2  --   --   --   --   --   --   --   --   --   --   CREATININE 1.76*  --   --  1.58*   < >  --    < > 1.00 0.86 0.88  --   CKTOTAL  --   --   --  59  --   --   --   --   --   --   --   TROPONINIHS 23* 23*  --   --   --   --   --   --   --   --   --    < > = values in this interval not displayed.    Estimated Creatinine Clearance: 85.7 mL/min (by C-G formula based on SCr of 0.88 mg/dL).   Medical History: Past Medical History:  Diagnosis Date   Adenomatous polyp    Atherosclerosis of aorta (HCC)    Cataract    Diabetes mellitus without complication (HCC)    Hearing loss, right    Hyperlipidemia    Hypertension    MRSA infection     Assessment: 66 year old male in atrial fibrillation, apparently had an episode of afib about one year ago. No current anti-coagulation. Starting apixaban. H&H is good. Renal function ok.   Goal of Therapy:  Monitor platelets by anticoagulation protocol: Yes   Plan:  Start Apixaban 5 mg BID Daily CBC while inpatient Monitor for bleeding Will need Apixaban education  Narda Bonds,  PharmD, BCPS Clinical Pharmacist Phone: [phone]

## 2021-09-17 NOTE — Progress Notes (Addendum)
Cross-coverage note:   Notified that patient is in rapid a fib with rate 140-160s. SBP is 119.   He reports hx of a fib ~1 yr ago, does not remember discussing anticoagulation at that time, denies hx of bleeding.   TSH was normal earlier today. No echo reports in EMR.   CHADS-VASc at least 4 (age, HTN, DM, vascular dz).   Likely triggered by his acute illness.   Plan to give IV Lopressor, repeat chem panel, start Eliquis.   ADDENDUM: HR sustaining >120 despite IV Lopressor. Plan to start diltiazem infusion.

## 2021-09-17 NOTE — H&P (Signed)
Snyder Colavito EIA:[id number] DOB: Dec 14, 1955 DOA: 09/16/2021     PCP: Cleatis Polka., MD   Outpatient Specialists: none    Patient arrived to ER on 09/16/21 at 1441 Referred by Attending Horton, Mayer Masker, MD   Patient coming from:    home Lives  With family    Chief Complaint:   Chief Complaint  Patient presents with   Weakness    HPI: Willie [name] is a 66 y.o. male with medical history significant of DM2, HTN    Presented with   fever cough No new subjective & objective note has been filed under this hospital service since the last note was generated. Reports some epigastric burning and pain   No tobacco no etoh   Initial COVID TEST  NEGATIVE   Lab Results  Component Value Date   SARSCOV2NAA NEGATIVE 09/16/2021     Regarding pertinent Chronic problems:     Hyperlipidemia - on statins lipitor Lipid Panel  No results found for: CHOL, TRIG, HDL, CHOLHDL, VLDL, LDLCALC, LDLDIRECT, LABVLDL   HTN on telmisartan       DM 2 -  on insulin,  Jiardiance ozempic     While in ER:   Noted to have evidence of sepsis, DKA UTI and multifocal PNA   Ordered  CT HEAD   NON acute  CXR -  NON acute  CTabd/pelvis    CT chest - Opacities in the right upper and lower left lower lobes , most likely due to multilobar pneumonia. There could be an aspiration component to the left lower lobe infiltratesOpacities in the right upper and lower left lower lobes , most likely due to multilobar pneumonia. There could be an aspiration component to the left lower lobe infiltrates Increased perinephric stranding could be chronic change or inflammatory. Constipation  Following Medications were ordered in ER: Medications  lactated ringers infusion (has no administration in time range)  lactated ringers bolus 1,000 mL (has no administration in time range)    And  lactated ringers bolus 1,000 mL (1,000 mLs Intravenous New Bag/Given 09/16/21 2318)    And  lactated ringers  bolus 250 mL (has no administration in time range)  metroNIDAZOLE (FLAGYL) IVPB 500 mg (500 mg Intravenous New Bag/Given 09/16/21 2323)  vancomycin (VANCOREADY) IVPB 1500 mg/300 mL (1,500 mg Intravenous New Bag/Given 09/16/21 2320)  ceFEPIme (MAXIPIME) 2 g in sodium chloride 0.9 % 100 mL IVPB (has no administration in time range)  vancomycin (VANCOCIN) IVPB 1000 mg/200 mL premix (has no administration in time range)  insulin regular, human (MYXREDLIN) 100 units/ 100 mL infusion (has no administration in time range)  dextrose 5 % in lactated ringers infusion (has no administration in time range)  dextrose 50 % solution 0-50 mL (has no administration in time range)  potassium chloride 10 mEq in 100 mL IVPB (has no administration in time range)  ondansetron (ZOFRAN) injection 4 mg (4 mg Intravenous Given 09/16/21 2309)  sodium bicarbonate injection 50 mEq (50 mEq Intravenous Given 09/16/21 2309)  ceFEPIme (MAXIPIME) 2 g in sodium chloride 0.9 % 100 mL IVPB (2 g Intravenous New Bag/Given 09/16/21 2322)    _ ER Provider Called:  PCCM     will see pt in ER     ED Triage Vitals  Enc Vitals Group     BP 09/16/21 1501 123/77     Pulse Rate 09/16/21 1501 (!) 106     Resp 09/16/21 1501 16     Temp 09/16/21 1551 (!)  97.5 F (36.4 C)     Temp Source 09/16/21 1551 Oral     SpO2 09/16/21 1501 98 %     Weight 09/16/21 2221 164 lb (74.4 kg)     Height --      Head Circumference --      Peak Flow --      Pain Score 09/16/21 1505 0     Pain Loc --      Pain Edu? --      Excl. in [place]? --   TMAX(24)@     _________________________________________ Significant initial  Findings: Abnormal Labs Reviewed  LACTIC ACID, PLASMA - Abnormal; Notable for the following components:      Result Value   Lactic Acid, Venous 2.2 (*)    All other components within normal limits  LACTIC ACID, PLASMA - Abnormal; Notable for the following components:   Lactic Acid, Venous 2.2 (*)    All other components within normal limits   COMPREHENSIVE METABOLIC PANEL - Abnormal; Notable for the following components:   Sodium 132 (*)    CO2 <7 (*)    Glucose, Bld 259 (*)    BUN 36 (*)    Creatinine, Ser 1.76 (*)    Total Protein 8.7 (*)    AST 12 (*)    GFR, Estimated 42 (*)    All other components within normal limits  CBC WITH DIFFERENTIAL/PLATELET - Abnormal; Notable for the following components:   WBC 22.3 (*)    Neutro Abs 20.2 (*)    Lymphs Abs 0.4 (*)    Basophils Absolute 0.2 (*)    Abs Immature Granulocytes 0.56 (*)    All other components within normal limits  URINALYSIS, ROUTINE W REFLEX MICROSCOPIC - Abnormal; Notable for the following components:   APPearance HAZY (*)    Glucose, UA >=500 (*)    Hgb urine dipstick MODERATE (*)    Ketones, ur 80 (*)    Protein, ur 100 (*)    Bacteria, UA MANY (*)    All other components within normal limits  BETA-HYDROXYBUTYRIC ACID - Abnormal; Notable for the following components:   Beta-Hydroxybutyric Acid >8.00 (*)    All other components within normal limits  LIPASE, BLOOD - Abnormal; Notable for the following components:   Lipase 56 (*)    All other components within normal limits  HEPATIC FUNCTION PANEL - Abnormal; Notable for the following components:   Albumin 2.9 (*)    AST 14 (*)    Total Bilirubin 1.6 (*)    Indirect Bilirubin 1.4 (*)    All other components within normal limits  MAGNESIUM - Abnormal; Notable for the following components:   Magnesium 2.8 (*)    All other components within normal limits  OSMOLALITY - Abnormal; Notable for the following components:   Osmolality 321 (*)    All other components within normal limits  BASIC METABOLIC PANEL - Abnormal; Notable for the following components:   Sodium 132 (*)    CO2 7 (*)    Glucose, Bld 280 (*)    BUN 45 (*)    Creatinine, Ser 1.58 (*)    GFR, Estimated 48 (*)    Anion gap 23 (*)    All other components within normal limits  CBC WITH DIFFERENTIAL/PLATELET - Abnormal; Notable for the  following components:   WBC 16.9 (*)    Neutro Abs 15.4 (*)    Lymphs Abs 0.4 (*)    Abs Immature Granulocytes 0.24 (*)  All other components within normal limits  CBG MONITORING, ED - Abnormal; Notable for the following components:   Glucose-Capillary 349 (*)    All other components within normal limits  I-STAT VENOUS BLOOD GAS, ED - Abnormal; Notable for the following components:   pH, Ven 7.178 (*)    pCO2, Ven 15.2 (*)    pO2, Ven 75.0 (*)    Bicarbonate 5.6 (*)    TCO2 6 (*)    Acid-base deficit 20.0 (*)    Sodium 133 (*)    Hemoglobin 17.3 (*)    All other components within normal limits  CBG MONITORING, ED - Abnormal; Notable for the following components:   Glucose-Capillary 311 (*)    All other components within normal limits  CBG MONITORING, ED - Abnormal; Notable for the following components:   Glucose-Capillary 240 (*)    All other components within normal limits  TROPONIN I (HIGH SENSITIVITY) - Abnormal; Notable for the following components:   Troponin I (High Sensitivity) 23 (*)    All other components within normal limits  TROPONIN I (HIGH SENSITIVITY) - Abnormal; Notable for the following components:   Troponin I (High Sensitivity) 23 (*)    All other components within normal limits     _________________________ Troponin  23 - 24 ECG: Ordered Personally reviewed by me showing: HR : 112 Rhythm:  Sinus tachycardia     no evidence of ischemic changes QTC 526   ____________________ This patient meets SIRS Criteria and may be septic.    The recent clinical data is shown below. Vitals:   09/16/21 1551 09/16/21 1710 09/16/21 1939 09/16/21 2221  BP:  (!) 149/66 (!) 143/67   Pulse:  (!) 112 (!) 113   Resp:  15 (!) 22   Temp: (!) 97.5 F (36.4 C)  98 F (36.7 C)   TempSrc: Oral     SpO2:  98% 97%   Weight:    74.4 kg      WBC     Component Value Date/Time   WBC 22.3 (H) 09/16/2021 1547   LYMPHSABS 0.4 (L) 09/16/2021 1547   MONOABS 1.0 09/16/2021  1547   EOSABS 0.1 09/16/2021 1547   BASOSABS 0.2 (H) 09/16/2021 1547        Lactic Acid, Venous    Component Value Date/Time   LATICACIDVEN 2.2 (HH) 09/16/2021 1932     Procalcitonin   Ordered Lactic Acid, Venous    Component Value Date/Time   LATICACIDVEN 1.6 09/17/2021 0105       UA  evidence of UTI     Urine analysis:    Component Value Date/Time   COLORURINE YELLOW 09/16/2021 2015   APPEARANCEUR HAZY (A) 09/16/2021 2015   LABSPEC 1.022 09/16/2021 2015   PHURINE 5.0 09/16/2021 2015   GLUCOSEU >=500 (A) 09/16/2021 2015   HGBUR MODERATE (A) 09/16/2021 2015   BILIRUBINUR NEGATIVE 09/16/2021 2015   KETONESUR 80 (A) 09/16/2021 2015   PROTEINUR 100 (A) 09/16/2021 2015   NITRITE NEGATIVE 09/16/2021 2015   LEUKOCYTESUR NEGATIVE 09/16/2021 2015    Results for orders placed or performed during the hospital encounter of 09/16/21  Resp Panel by RT-PCR (Flu A&B, Covid) Peripheral     Status: None   Collection Time: 09/16/21  3:30 PM   Specimen: Peripheral; Nasopharyngeal(NP) swabs in vial transport medium  Result Value Ref Range Status   SARS Coronavirus 2 by RT PCR NEGATIVE NEGATIVE Final         Influenza A by PCR NEGATIVE  NEGATIVE Final   Influenza B by PCR NEGATIVE NEGATIVE Final          _______________________________________________ Hospitalist was called for admission for  DKA, sepsis, multilobar PNA  The following Work up has been ordered so far:  Orders Placed This Encounter  Procedures   Critical Care   Blood Culture (routine x 2)   Urine Culture   Resp Panel by RT-PCR (Flu A&B, Covid) Nasopharyngeal Swab   Expectorated Sputum Assessment w Gram Stain, Rflx to Resp Cult   DG Chest 1 View   CT HEAD WO CONTRAST ( )   CT CHEST ABDOMEN PELVIS WO CONTRAST   Lactic acid, plasma   Comprehensive metabolic panel   CBC WITH DIFFERENTIAL   Protime-INR   APTT   Urinalysis, Routine w reflex microscopic   Blood gas, venous (at [facility] and [facility], not at [facility])    Beta-hydroxybutyric acid   Lipase, blood   Lactic acid, plasma   Lactic acid, plasma   Procalcitonin   CK   Differential   Hepatic function panel   Magnesium   Phosphorus   Creatinine, urine, random   Osmolality, urine   Osmolality   TSH   Sodium, urine, random   HIV Antibody (routine testing w rflx)   Legionella Pneumophila Serogp 1 Ur Ag   Strep pneumoniae urinary antigen   Diet NPO time specified   Cardiac monitoring   Document height and weight   Assess and Document Glasgow Coma Scale   Document vital signs within 1-hour of fluid bolus completion.  Notify provider of abnormal vital signs despite fluid resuscitation.   Refer to Sidebar Report: Sepsis Bundle ED/IP   Notify provider for difficulties obtaining IV access   Initiate Carrier Fluid Protocol   DO NOT delay antibiotics if unable to obtain blood culture.   Notify physician (specify)   If present, discontinue Insulin Pump after IV Insulin is initiated.   Do NOT use lab glucose values in [employer].  If CBG meter reads "Critical High", enter 600.   IV insulin infusion with sufficient glucose should be continued until MD determines acidosis is corrected and places transition orders.   Upon IV fluid bolus completion, place order for STAT BMET (LAB15) and call provider with results.   IV bolus already initiated   Refer to Sidebar Report: Sepsis Bundle ED/IP   If lactate (lactic acid) >2, verify repeat lactic acid order has been placed to be drawn   Document vital signs within 1-hour of fluid bolus completion and notify provider of bolus completion   Vital signs   Vital signs   Assess and Document Glasgow Coma Scale   pharmacy consult   Code Sepsis activation.  This occurs automatically when order is signed and prioritizes pharmacy, lab, and radiology services for STAT collections and interventions.  If [facility] downtime, call [facility] [phone]) to activate Code Sepsis.   ceFEPime (MAXIPIME) per pharmacy consult             vancomycin per pharmacy consult   Consult to hospitalist   Pharmacy Consult   ceFEPime (MAXIPIME) per pharmacy consult            vancomycin per pharmacy consult   Consult to intensivist   Airborne and Contact precautions   Pulse oximetry, continuous   CBG monitoring, ED   I-Stat venous blood gas, ED   I-Stat venous blood gas, [facility] ED)   ED EKG 12-Lead   Insert peripheral IV X 1   Saline lock IV  OTHER Significant initial  Findings:  labs showing:    Recent Labs  Lab 09/16/21 1547 09/16/21 2237 09/17/21 0105  NA 132* 133* 132*  K 4.8 4.5 4.7  CO2 <7*  --  7*  GLUCOSE 259*  --  280*  BUN 36*  --  45*  CREATININE 1.76*  --  1.58*  CALCIUM 9.9  --  9.0  MG  --   --  2.8*  PHOS  --   --  3.4    Cr   Up from baseline see below Lab Results  Component Value Date   CREATININE 1.58 (H) 09/17/2021   CREATININE 1.76 (H) 09/16/2021    Recent Labs  Lab 09/16/21 1547  AST 12*  ALT 16  ALKPHOS 119  BILITOT 1.1  PROT 8.7*  ALBUMIN 4.1   Lab Results  Component Value Date   CALCIUM 9.9 09/16/2021          Plt: Lab Results  Component Value Date   PLT 397 09/16/2021    Venous  Blood Gas result:  pH 7.178 pCO2 15.2  ABG    Component Value Date/Time   HCO3 5.6 (L) 09/16/2021 2237   TCO2 6 (L) 09/16/2021 2237   ACIDBASEDEF 20.0 (H) 09/16/2021 2237   O2SAT 92.0 09/16/2021 2237         Recent Labs  Lab 09/16/21 1547 09/16/21 2237 09/17/21 0105  WBC 22.3*  --  16.9*  NEUTROABS 20.2*  --  15.4*  HGB 17.0 17.3* 14.5  HCT 51.2 51.0 44.8  MCV 92.1  --  92.8  PLT 397  --  294    HG/HCT   stable,      Component Value Date/Time   HGB 17.3 (H) 09/16/2021 2237   HCT 51.0 09/16/2021 2237   MCV 92.1 09/16/2021 1547      Recent Labs  Lab 09/16/21 2229  LIPASE 56*   No results for input(s): AMMONIA in the last 168 hours.    Cardiac Panel (last 3 results) Recent Labs    09/17/21 0105  CKTOTAL 59    DM  labs:  HbA1C: No results for  input(s): HGBA1C in the last 8760 hours.     CBG (last 3)  Recent Labs    09/16/21 2219  GLUCAP 349*      Cultures: No results found for: SDES, SPECREQUEST, CULT, REPTSTATUS   Radiological Exams on Admission: DG Chest 1 View  Result Date: 09/16/2021 CLINICAL DATA:  Cough.  Chest pain.  Vomiting. EXAM: CHEST  1 VIEW COMPARISON:  None. FINDINGS: The heart size and mediastinal contours are within normal limits. Both lungs are clear. The visualized skeletal structures are unremarkable. IMPRESSION: No active disease. Electronically Signed   By: Marlaine Hind M.D.   On: 09/16/2021 16:24   CT HEAD WO CONTRAST (5MM)  Result Date: 09/16/2021 CLINICAL DATA:  Weakness, headache, emesis EXAM: CT HEAD WITHOUT CONTRAST TECHNIQUE: Contiguous axial images were obtained from the base of the skull through the vertex without intravenous contrast. RADIATION DOSE REDUCTION: This exam was performed according to the departmental dose-optimization program which includes automated exposure control, adjustment of the mA and/or kV according to patient size and/or use of iterative reconstruction technique. COMPARISON:  11/09/2016 FINDINGS: Brain: No acute infarct or hemorrhage. Lateral ventricles and midline structures are unremarkable. No acute extra-axial fluid collections. No mass effect. Vascular: No hyperdense vessel or unexpected calcification. Skull: Normal. Negative for fracture or focal lesion. Sinuses/Orbits: Gas fluid levels are seen within the bilateral  maxillary sinuses. Moderate mucosal thickening within the anterior ethmoid air cells. Other: None. IMPRESSION: 1. Acute bilateral maxillary sinusitis. 2. No acute intracranial process. Electronically Signed   By: Randa Ngo M.D.   On: 09/16/2021 23:10   CT CHEST ABDOMEN PELVIS WO CONTRAST  Result Date: 09/16/2021 CLINICAL DATA:  Sepsis workup with history of diabetes. EXAM: CT CHEST, ABDOMEN AND PELVIS WITHOUT CONTRAST TECHNIQUE: Multidetector CT imaging of  the chest, abdomen and pelvis was performed following the standard protocol without IV contrast. RADIATION DOSE REDUCTION: This exam was performed according to the departmental dose-optimization program which includes automated exposure control, adjustment of the mA and/or kV according to patient size and/or use of iterative reconstruction technique. COMPARISON:  Portable chest today, CT abdomen and pelvis no contrast 05/27/2011. FINDINGS: CT CHEST FINDINGS Factors affecting image quality: Abundant patient motion mostly due to breathing limits both the chest and abdomen CT evaluation. Cardiovascular: The cardiac size is normal. There is no pericardial effusion. There is patchy calcification in the LAD and right coronary arteries. There is aortic calcific atherosclerosis in the arch and distal descending segment. The aorta is normal in caliber and course with scattered calcification in the great vessels. The pulmonary arteries and veins are normal caliber. Mediastinum/Nodes: Thyroid gland, axillary spaces are unremarkable. There are borderline sized precarinal, subcarinal and left hilar nodes up 9 mm in short axis but no enlarged nodes by size criteria. Trachea is unremarkable, as far as visualized. There is a patulous esophagus with retained or refluxed fluid in the upper to midportion, small hiatal hernia and mild circumferential thickening in the distal esophagus. Lungs/Pleura: There is patchy dense consolidation in the left lower lobe basal segments and some images suggest second and third order endobronchial debris or fluid within the consolidated portions. On the right, there is patchy ground-glass consolidation in the upper lobe, greatest in the posterior segment, scattered ground-glass opacities in the lower lobe. The left upper and right middle lobes are clear. There is no pleural effusion, thickening or pneumothorax. Musculoskeletal: Slight thoracic dextroscoliosis. There is degenerative disc disease and  spondylosis of the thoracic spine. Mild osteopenia. No spinal compression fracture is seen. No worrisome regional bone lesion. Ribcage is intact. CT ABDOMEN PELVIS FINDINGS Hepatobiliary: The liver not well seen due to breathing motion. It appears to be mildly steatotic and measures approximately 18 cm length. There is no obvious mass. The gallbladder and bile ducts show no obvious abnormality. Pancreas: Also not well seen. No obvious focal abnormality or adjacent edema. Spleen: No focal abnormality or splenomegaly. Adrenals/Urinary Tract: There is no adrenal mass. There is a 4 mm nonobstructive caliceal stone in the superior pole of the left kidney and a few punctate nonobstructing caliceal stones in the left lower pole. No right intrarenal stone is seen and no obstructing stones or hydronephrosis either side. In the left lower pole there is a 3.2 cm cyst of 18.2 Hounsfield units which was previously 2.6 cm. There is increased bilateral perinephric stranding which could be chronic senescent change or inflammatory. The bladder is moderately distended but has a normal wall thickness. Stomach/Bowel: The stomach moderately distended with fluid. No obvious outlet obstructing mass is seen or duodenal dilatation. There is slight dilatation and thickening of jejunal segments up to 2.7 cm in diameter. The remainder of the small bowel is normal caliber. The appendix is normal. There is moderate stool retention ascending and transverse colon. No evidence of acute colitis or diverticulitis is seen through the breathing motion. Vascular/Lymphatic: Moderate aortoiliac  atherosclerosis without AAA. No enlarged lymph nodes are seen. Reproductive: The prostate has enlarged compared to prior study now measuring 4.8 cm previously 4.6 cm. Calcifications are again noted in the right lobe. Other: No incarcerated hernia is seen and no free air, hemorrhage or fluid, or acute inflammatory changes are seen. Musculoskeletal: There is  osteopenia and degenerative changes of the lumbar spine. Stable 1.1 cm sclerotic lesion in the posterior right ilium presumably bone island. Bilateral mild-to-moderate hip DJD is seen as well. Degenerative lumbar foraminal stenosis L4-5 and more so L5-S1. IMPRESSION: 1. Opacities in the right upper and lower left lower lobes , most likely due to multilobar pneumonia. There could be an aspiration component to the left lower lobe infiltrates. Follow-up study recommended after treatment to ensure clearing. 2. Borderline sized lymph nodes in the mediastinum, left hilum. No encasing or bulky adenopathy or enlarged lymph nodes by strict size criteria. 3. Small hiatal hernia with a patulous esophagus with fluid retention or reflux in the thoracic esophagus and wall thickening in the distal esophagus. Consider endoscopic follow-up if clinically warranted. 4. Coronary artery and aortic atherosclerosis.  No AAA. 5. Fluid dilatation of the stomach, without duodenal dilatation, could be due to a recent ingestion or impaired gastric emptying. 6. Slightly prominent thickened jejunal segments. Remaining small bowel is normal caliber. Findings could be due to a low-grade jejunal partial obstruction or ileus/enteritis. 7. Constipation and diverticulosis. 8. Fatty liver. 9. Nonobstructive nephrolithiasis on the left. No obstructing stone is seen. Increased perinephric stranding could be chronic change or inflammatory. 10. Prostatomegaly. 11. Osteopenia and degenerative change. Electronically Signed   By: Telford Nab M.D.   On: 09/16/2021 23:29   _______________________________________________________________________________________________________ Latest   Blood pressure (!) 143/67, pulse (!) 113, temperature 98 F (36.7 C), resp. rate (!) 22, weight 74.4 kg, SpO2 97 %.   Vitals  labs and radiology finding personally reviewed  Review of Systems:    Pertinent positives include:  Fevers, chills, fatigue,   Constitutional:   No weight loss, night sweats, weight loss  HEENT:  No headaches, Difficulty swallowing,Tooth/dental problems,Sore throat,  No sneezing, itching, ear ache, nasal congestion, post nasal drip,  Cardio-vascular:  No chest pain, Orthopnea, PND, anasarca, dizziness, palpitations.no Bilateral lower extremity swelling  GI:  No heartburn, indigestion, abdominal pain, nausea, vomiting, diarrhea, change in bowel habits, loss of appetite, melena, blood in stool, hematemesis Resp:  no shortness of breath at rest. No dyspnea on exertion, No excess mucus, no productive cough, No non-productive cough, No coughing up of blood.No change in color of mucus.No wheezing. Skin:  no rash or lesions. No jaundice GU:  no dysuria, change in color of urine, no urgency or frequency. No straining to urinate.  No flank pain.  Musculoskeletal:  No joint pain or no joint swelling. No decreased range of motion. No back pain.  Psych:  No change in mood or affect. No depression or anxiety. No memory loss.  Neuro: no localizing neurological complaints, no tingling, no weakness, no double vision, no gait abnormality, no slurred speech, no confusion  All systems reviewed and apart from [place] all are negative _______________________________________________________________________________________________ Past Medical History:   Past Medical History:  Diagnosis Date   Adenomatous polyp    Atherosclerosis of aorta (HCC)    Cataract    Diabetes mellitus without complication (HCC)    Hearing loss, right    Hyperlipidemia    Hypertension    MRSA infection      Past Surgical History:  Procedure  Laterality Date   Cervical Lymph Node Skin Biopsy Left    COLONOSCOPY     CYST REMOVAL LEG     DG THUMB LEFT HAND     INCISE AND DRAIN ABCESS     thumb   POLYPECTOMY      Social History:  Ambulatory   independently       reports that he has quit smoking. He has never used smokeless tobacco. He reports current alcohol  use. He reports that he does not use drugs.     Family History:   Family History  Problem Relation Age of Onset   Diabetes Mother    Parkinson's disease Mother    Lung cancer Father    Heart attack Father    Breast cancer Sister    CAD Brother    Diabetes Maternal Grandmother    Lung cancer Paternal Grandfather    Colon cancer Neg Hx    Stomach cancer Neg Hx    Colon polyps Neg Hx    Esophageal cancer Neg Hx    Rectal cancer Neg Hx    ______________________________________________________________________________________________ Allergies: No Known Allergies   Prior to Admission medications   Medication Sig Start Date End Date Taking? Authorizing Provider  aspirin 81 MG tablet Take 81 mg by mouth daily.   Yes [provider]  atorvastatin (LIPITOR) 40 MG tablet Take 40 mg by mouth daily.   Yes [provider]  DULoxetine (CYMBALTA) 60 MG capsule Take 60 mg by mouth daily. 11/10/20  Yes [provider]  empagliflozin (JARDIANCE) 25 MG TABS tablet Take 25 mg by mouth daily.    Yes [provider]  gabapentin (NEURONTIN) 300 MG capsule Take 300 mg by mouth 3 (three) times daily.   Yes [provider]  metFORMIN (GLUCOPHAGE) 1000 MG tablet Take 1,000 mg by mouth 2 (two) times daily with a meal.   Yes [provider]  Multiple Vitamins-Minerals (MULTIVITAMIN GUMMIES ADULTS PO) Take 1 tablet by mouth daily.   Yes [provider]  naproxen (NAPROSYN) 500 MG tablet Take 500 mg by mouth 2 (two) times daily as needed for mild pain.   Yes [provider]  OZEMPIC, 1 MG/DOSE, 2 MG/1.5ML SOPN Inject 1 mg into the skin every Saturday. 12/01/19  Yes [provider]  telmisartan (MICARDIS) 40 MG tablet Take 40 mg by mouth daily.   Yes [provider]  TRESIBA FLEXTOUCH 100 UNIT/ML FlexTouch Pen Inject 40 Units into the skin daily. 08/13/19  Yes [provider]     ___________________________________________________________________________________________________ Physical Exam: Vitals with BMI 09/16/2021 09/16/2021 09/16/2021  Height - - -  Weight 164 lbs - -  BMI - - -  Systolic - 132 440  Diastolic - 67 66  Pulse - 113 112     1. General:  in No  Acute distress   toxic acutely ill -appearing 2. Psychological: Alert and   Oriented to self not situation 3. Head/ENT:    Dry Mucous Membranes                          Head Non traumatic, neck supple                           Poor Dentition 4. SKIN:  decreased Skin turgor,  Skin clean Dry and intact no rash 5. Heart: Regular rate and rhythm no  Murmur, no Rub or gallop 6.  Lungs:  no wheezes or crackles   7. Abdomen: Soft,  non-tender, Non distended  bowel sounds present 8. Lower extremities: no clubbing, cyanosis, no  edema 9. Neurologically Grossly intact, moving all 4 extremities equally  10. MSK: Normal range of motion    Chart has been reviewed  ______________________________________________________________________________________________  Assessment/Plan 66 y.o. male with medical history significant of DM2, HTN  Admitted for  DKA, sepsis, multilobar PNA  Present on Admission:  Hypertension  Hyperlipemia  CAP (community acquired pneumonia)  Severe sepsis ([place])  DKA, type 2, not at goal [facility])  Diabetic neuropathy with neurologic complication ([place])  UTI (urinary tract infection)  Ileus ([place])     CAP (community acquired pneumonia)  - -Patient presenting with fatigue and infiltrate on chest x-ray -Infiltrate on CXR and 2-3 characteristics (fever, leukocytosis, purulent sputum) are consistent with pneumonia.    will admit for treatment of CAP will start on appropriate antibiotic coverage. -cefepime vanc   Obtain:  sputum cultures,                influenza serologies negative              blood cultures and sputum cultures ordered                   strep pneumo UA antigen,                    check for Legionella antigen.                Provide oxygen as needed.     Hypertension Allow permissive htn  Hyperlipemia Continue home meds  Severe sepsis ([place])  -SIRS criteria met with  elevated white blood cell count,       Component Value Date/Time   WBC 22.3 (H) 09/16/2021 1547   LYMPHSABS 0.4 (L) 09/16/2021 1547    tachycardia   ,  RR >20 Today's Vitals   09/16/21 1551 09/16/21 1710 09/16/21 1939 09/16/21 2221  BP:  (!) 149/66 (!) 143/67   Pulse:  (!) 112 (!) 113   Resp:  15 (!) 22   Temp: (!) 97.5 F (36.4 C)  98 F (36.7 C)   TempSrc: Oral     SpO2:  98% 97%   Weight:    74.4 kg  PainSc:         The recent clinical data is shown below. Vitals:   09/16/21 1551 09/16/21 1710 09/16/21 1939 09/16/21 2221  BP:  (!) 149/66 (!) 143/67   Pulse:  (!) 112 (!) 113   Resp:  15 (!) 22   Temp: (!) 97.5 F (36.4 C)  98 F (36.7 C)   TempSrc: Oral     SpO2:  98% 97%   Weight:    74.4 kg     -Most likely source being:  Pulmonary,   Patient meeting criteria for Severe sepsis with    evidence of end organ damage/organ dysfunction such as    elevated lactic acid >2     Component Value Date/Time   LATICACIDVEN 2.2 ([place]) 09/16/2021 1932          - Obtain serial lactic acid and procalcitonin level.  - Initiated IV antibiotics in ER: Antibiotics Given (last 72 hours)     Date/Time Action Medication Dose Rate   09/16/21 2320 New Bag/Given   vancomycin (VANCOREADY) IVPB 1500 mg/300 mL 1,500 mg 150 mL/hr   09/16/21 2322 New Bag/Given   ceFEPIme (MAXIPIME) 2 g  in sodium chloride 0.9 % 100 mL IVPB 2 g 200 mL/hr   09/16/21 2323 New Bag/Given   metroNIDAZOLE (FLAGYL) IVPB 500 mg 500 mg 100 mL/hr       Will continue [email or web address]  - await results of blood and urine culture  - Rehydrate aggressively  Intravenous fluids were administered       30cc/kg fluid     12:01 AM   DKA, type 2, not at goal [facility]) Pt is on Jardiance That is the likely cause  of   DKA will admit per DKA , obtain serial BMET, start on glucosestabalizer, aggressive IVF.   Change IVF to D5 1/2Na after BG <250 .    Monitor in [place]. Replace potassium as needed.      Consult diabetes coordinator       Diabetic neuropathy with neurologic complication (HCC) restart home meds when able to tolerate  UTI (urinary tract infection) Possible UTI antibiotics await results of urine culture  Ileus (HCC) Possible ileus on CT N.p.o. Likely in the setting of sepsis and critical illness   Other plan as per orders.  DVT prophylaxis:  hep [place]     Code Status:    Code Status: Not on file FULL CODE  as per  family  I had personally discussed CODE STATUS with patient and family      Family Communication:   Family  at  Bedside  plan of care was discussed with  Wife,    Disposition Plan:        To home once workup is complete and patient is stable   Following barriers for discharge:                            Electrolytes corrected                                                          Afebrile, white count improving able to transition to PO antibiotics                    Consults called: PCCM aware  Admission status:  ED Disposition     ED Disposition  Admit   Condition  --   Comment  [facility] [[id number]]  Level of Care: Progressive [102]  Admit to Progressive based on following criteria: MULTISYSTEM THREATS such as stable sepsis, metabolic/electrolyte imbalance with or without encephalopathy that is responding to early treatment.  May admit patient to [facility] or [facility] if equivalent level of care is available:: No  Covid Evaluation: Asymptomatic Screening Protocol (No Symptoms)  Diagnosis: Sepsis [facility]) [[id number]]  Admitting Physician: Therisa Doyne [[id number]]  Attending Physician: Therisa Doyne [[id number]]  Estimated length of stay: past midnight tomorrow  Certification:: I certify this patient will need  inpatient services for at least 2 midnights           inpatient     I Expect 2 midnight stay secondary to severity of patient's current illness need for inpatient interventions justified by the following:  hemodynamic instability despite optimal treatment (tachycardia   tachypnea )  Severe lab/radiological/exam abnormalities including:     and extensive comorbidities including:  DM2    That are currently affecting medical management.  I expect  patient to be hospitalized for 2 midnights requiring inpatient medical care.  Patient is at high risk for adverse outcome (such as loss of life or disability) if not treated.  Indication for inpatient stay as follows:  Severe change from baseline regarding mental status Hemodynamic instability despite maximal medical therapy,  ongoing suicidal ideations,  severe pain requiring acute inpatient management,  inability to maintain oral hydration   persistent chest pain despite medical management Need for operative/procedural  intervention New or worsening hypoxia   Need for IV antibiotics, IV fluids, IV rate controling medications, IV antihypertensives, IV pain medications, IV anticoagulation, need for biPAP    Level of care       progressive tele indefinitely please discontinue once patient no longer qualifies COVID-19 Labs    Lab Results  Component Value Date   [place] NEGATIVE 09/16/2021     Precautions: admitted as   Covid Negative      Aayan Haskew 09/17/2021, 2:41 AM    [facility]     after 2 AM please page floor coverage PA If 7AM-7PM, please contact the day team taking care of the patient using [email or web address]   Patient was evaluated in the context of the global COVID-19 pandemic, which necessitated consideration that the patient might be at risk for infection with the SARS-CoV-2 virus that causes COVID-19. Institutional protocols and algorithms that pertain to the evaluation of patients at risk for  COVID-19 are in a state of rapid change based on information released by regulatory bodies including the CDC and federal and state organizations. These policies and algorithms were followed during the patient's care.

## 2021-09-17 NOTE — ED Notes (Signed)
Micro lab contacted to add on 20 pathogen respiratory panel to existing swab

## 2021-09-17 NOTE — Progress Notes (Signed)
Inpatient Diabetes Program Recommendations  AACE/ADA: New Consensus Statement on Inpatient Glycemic Control (2015)  Target Ranges:  Prepandial:   less than 140 mg/dL      Peak postprandial:   less than 180 mg/dL (1-2 hours)      Critically ill patients:  140 - 180 mg/dL   Lab Results  Component Value Date   GLUCAP 289 (H) 09/17/2021   HGBA1C 7.7 (H) 09/17/2021    Review of Glycemic Control  Diabetes history: DM 2 Outpatient Diabetes medications: Tresiba 40 units Daily, Ozempic 1 mg QSaturday, Jardiance 25 mg Daily, Metformin 1000 mg bid Current orders for Inpatient glycemic control:  IV insulin/Endotool/DKA  On Jardiance at home precipitating DKA, hyperglycemia due to Medical issues A1c almost at goal at 7.7% 2/8  Inpatient Diabetes Program Recommendations:    IV insulin for now  At time of transition consider: -  Semglee 30 units -  Novolog 0-15 units tid + hs  Thanks,  Tama Headings RN, MSN, BC-ADM Inpatient Diabetes Coordinator Team Pager [phone] (8a-5p)

## 2021-09-17 NOTE — Assessment & Plan Note (Addendum)
Possible UTI antibiotics await results of urine culture

## 2021-09-17 NOTE — Assessment & Plan Note (Signed)
-  SIRS criteria met with  elevated white blood cell count,       Component Value Date/Time   WBC 22.3 (H) 09/16/2021 1547   LYMPHSABS 0.4 (L) 09/16/2021 1547    tachycardia   ,  RR >20 Today's Vitals   09/16/21 1551 09/16/21 1710 09/16/21 1939 09/16/21 2221  BP:  (!) 149/66 (!) 143/67   Pulse:  (!) 112 (!) 113   Resp:  15 (!) 22   Temp: (!) 97.5 F (36.4 C)  98 F (36.7 C)   TempSrc: Oral     SpO2:  98% 97%   Weight:    74.4 kg  PainSc:         The recent clinical data is shown below. Vitals:   09/16/21 1551 09/16/21 1710 09/16/21 1939 09/16/21 2221  BP:  (!) 149/66 (!) 143/67   Pulse:  (!) 112 (!) 113   Resp:  15 (!) 22   Temp: (!) 97.5 F (36.4 C)  98 F (36.7 C)   TempSrc: Oral     SpO2:  98% 97%   Weight:    74.4 kg     -Most likely source being:  Pulmonary,   Patient meeting criteria for Severe sepsis with    evidence of end organ damage/organ dysfunction such as    elevated lactic acid >2     Component Value Date/Time   LATICACIDVEN 2.2 ([place]) 09/16/2021 1932          - Obtain serial lactic acid and procalcitonin level.  - Initiated IV antibiotics in ER: Antibiotics Given (last 72 hours)    Date/Time Action Medication Dose Rate   09/16/21 2320 New Bag/Given   vancomycin (VANCOREADY) IVPB 1500 mg/300 mL 1,500 mg 150 mL/hr   09/16/21 2322 New Bag/Given   ceFEPIme (MAXIPIME) 2 g in sodium chloride 0.9 % 100 mL IVPB 2 g 200 mL/hr   09/16/21 2323 New Bag/Given   metroNIDAZOLE (FLAGYL) IVPB 500 mg 500 mg 100 mL/hr      Will continue [email or web address].vanc  - await results of blood and urine culture  - Rehydrate aggressively  Intravenous fluids were administered       30cc/kg fluid     12:01 AM

## 2021-09-17 NOTE — Assessment & Plan Note (Signed)
Pt is on Jardiance That is the likely cause of   DKA will admit per DKA , obtain serial BMET, start on glucosestabalizer, aggressive IVF.   Change IVF to D5 1/2Na after BG <250 .    Monitor in [place]. Replace potassium as needed.      Consult diabetes coordinator

## 2021-09-17 NOTE — Assessment & Plan Note (Signed)
- -  Patient presenting with fatigue and infiltrate on chest x-ray -Infiltrate on CXR and 2-3 characteristics (fever, leukocytosis, purulent sputum) are consistent with pneumonia.    will admit for treatment of CAP will start on appropriate antibiotic coverage. -cefepime vanc   Obtain:  sputum cultures,                influenza serologies negative              blood cultures and sputum cultures ordered                   strep pneumo UA antigen,                   check for Legionella antigen.                Provide oxygen as needed.

## 2021-09-17 NOTE — Progress Notes (Signed)
Was asked to see the patient needs ICU admission visit on the patient vital signs clear chest x-ray no UTI and DKA needs to be treated with aggressive IV fluid resuscitation given Zosyn being given antibiotic by the primary team he has no indication for ICU admission.

## 2021-09-17 NOTE — Assessment & Plan Note (Signed)
Possible ileus on CT N.p.o. Likely in the setting of sepsis and critical illness

## 2021-09-17 NOTE — Assessment & Plan Note (Signed)
restart home meds when able to tolerate

## 2021-09-18 ENCOUNTER — Encounter (HOSPITAL_COMMUNITY): Payer: Self-pay | Admitting: Internal Medicine

## 2021-09-18 LAB — URINE CULTURE: Culture: NO GROWTH

## 2021-09-18 LAB — BASIC METABOLIC PANEL
Anion gap: 23 — ABNORMAL HIGH (ref 5–15)
Anion gap: 10 (ref 5–15)
Anion gap: 12 (ref 5–15)
Anion gap: 8 (ref 5–15)
Anion gap: 12 (ref 5–15)
BUN: 45 mg/dL — ABNORMAL HIGH (ref 8–23)
BUN: 13 mg/dL (ref 8–23)
BUN: 17 mg/dL (ref 8–23)
BUN: 15 mg/dL (ref 8–23)
BUN: 16 mg/dL (ref 8–23)
CO2: 7 mmol/L — ABNORMAL LOW (ref 22–32)
CO2: 12 mmol/L — ABNORMAL LOW (ref 22–32)
CO2: 18 mmol/L — ABNORMAL LOW (ref 22–32)
CO2: 20 mmol/L — ABNORMAL LOW (ref 22–32)
CO2: 19 mmol/L — ABNORMAL LOW (ref 22–32)
Calcium: 9 mg/dL (ref 8.9–10.3)
Calcium: 6.3 mg/dL — CL (ref 8.9–10.3)
Calcium: 9.4 mg/dL (ref 8.9–10.3)
Calcium: 9.1 mg/dL (ref 8.9–10.3)
Calcium: 9.3 mg/dL (ref 8.9–10.3)
Chloride: 102 mmol/L (ref 98–111)
Chloride: 120 mmol/L — ABNORMAL HIGH (ref 98–111)
Chloride: 108 mmol/L (ref 98–111)
Chloride: 106 mmol/L (ref 98–111)
Chloride: 105 mmol/L (ref 98–111)
Creatinine, Ser: 1.58 mg/dL — ABNORMAL HIGH (ref 0.61–1.24)
Creatinine, Ser: 0.51 mg/dL — ABNORMAL LOW (ref 0.61–1.24)
Creatinine, Ser: 0.8 mg/dL (ref 0.61–1.24)
Creatinine, Ser: 0.75 mg/dL (ref 0.61–1.24)
Creatinine, Ser: 0.72 mg/dL (ref 0.61–1.24)
GFR, Estimated: 48 mL/min — ABNORMAL LOW (ref >60)
GFR, Estimated: >60 mL/min (ref >60)
GFR, Estimated: >60 mL/min (ref >60)
GFR, Estimated: >60 mL/min (ref >60)
GFR, Estimated: >60 mL/min (ref >60)
Glucose, Bld: 280 mg/dL — ABNORMAL HIGH (ref 70–99)
Glucose, Bld: 107 mg/dL — ABNORMAL HIGH (ref 70–99)
Glucose, Bld: 143 mg/dL — ABNORMAL HIGH (ref 70–99)
Glucose, Bld: 195 mg/dL — ABNORMAL HIGH (ref 70–99)
Glucose, Bld: 136 mg/dL — ABNORMAL HIGH (ref 70–99)
Potassium: 4.7 mmol/L (ref 3.5–5.1)
Potassium: 2.5 mmol/L — CL (ref 3.5–5.1)
Potassium: 3.2 mmol/L — ABNORMAL LOW (ref 3.5–5.1)
Potassium: 3.7 mmol/L (ref 3.5–5.1)
Potassium: 3.7 mmol/L (ref 3.5–5.1)
Sodium: 132 mmol/L — ABNORMAL LOW (ref 135–145)
Sodium: 142 mmol/L (ref 135–145)
Sodium: 138 mmol/L (ref 135–145)
Sodium: 134 mmol/L — ABNORMAL LOW (ref 135–145)
Sodium: 136 mmol/L (ref 135–145)

## 2021-09-18 LAB — CBG MONITORING, ED
Glucose-Capillary: 146 mg/dL — ABNORMAL HIGH (ref 70–99)
Glucose-Capillary: 176 mg/dL — ABNORMAL HIGH (ref 70–99)
Glucose-Capillary: 127 mg/dL — ABNORMAL HIGH (ref 70–99)
Glucose-Capillary: 121 mg/dL — ABNORMAL HIGH (ref 70–99)
Glucose-Capillary: 135 mg/dL — ABNORMAL HIGH (ref 70–99)
Glucose-Capillary: 152 mg/dL — ABNORMAL HIGH (ref 70–99)
Glucose-Capillary: 138 mg/dL — ABNORMAL HIGH (ref 70–99)
Glucose-Capillary: 163 mg/dL — ABNORMAL HIGH (ref 70–99)
Glucose-Capillary: 197 mg/dL — ABNORMAL HIGH (ref 70–99)

## 2021-09-18 LAB — CBC WITH DIFFERENTIAL/PLATELET
Abs Immature Granulocytes: 0 10*3/uL (ref 0.00–0.07)
Basophils Absolute: 0 10*3/uL (ref 0.0–0.1)
Basophils Relative: 0 %
Eosinophils Absolute: 0 10*3/uL (ref 0.0–0.5)
Eosinophils Relative: 0 %
HCT: 38.5 % — ABNORMAL LOW (ref 39.0–52.0)
Hemoglobin: 13 g/dL (ref 13.0–17.0)
Lymphocytes Relative: 13 %
Lymphs Abs: 1.9 10*3/uL (ref 0.7–4.0)
MCH: 30.1 pg (ref 26.0–34.0)
MCHC: 33.8 g/dL (ref 30.0–36.0)
MCV: 89.1 fL (ref 80.0–100.0)
Monocytes Absolute: 1.5 10*3/uL — ABNORMAL HIGH (ref 0.1–1.0)
Monocytes Relative: 10 %
Neutro Abs: 11.2 10*3/uL — ABNORMAL HIGH (ref 1.7–7.7)
Neutrophils Relative %: 77 %
Platelets: 253 10*3/uL (ref 150–400)
RBC: 4.32 MIL/uL (ref 4.22–5.81)
RDW: 13.2 % (ref 11.5–15.5)
WBC: 14.6 10*3/uL — ABNORMAL HIGH (ref 4.0–10.5)
nRBC: 0 % (ref 0.0–0.2)
nRBC: 0 /100 WBC

## 2021-09-18 LAB — LACTIC ACID, PLASMA: Lactic Acid, Venous: 1.7 mmol/L (ref 0.5–1.9)

## 2021-09-18 LAB — PROCALCITONIN: Procalcitonin: 0.77 ng/mL

## 2021-09-18 LAB — LEGIONELLA PNEUMOPHILA SEROGP 1 UR AG: L. pneumophila Serogp 1 Ur Ag: NEGATIVE

## 2021-09-18 LAB — BETA-HYDROXYBUTYRIC ACID
Beta-Hydroxybutyric Acid: 1.77 mmol/L — ABNORMAL HIGH (ref 0.05–0.27)
Beta-Hydroxybutyric Acid: 2.53 mmol/L — ABNORMAL HIGH (ref 0.05–0.27)

## 2021-09-18 LAB — MAGNESIUM: Magnesium: 2.5 mg/dL — ABNORMAL HIGH (ref 1.7–2.4)

## 2021-09-18 LAB — GLUCOSE, CAPILLARY
Glucose-Capillary: 126 mg/dL — ABNORMAL HIGH (ref 70–99)
Glucose-Capillary: 132 mg/dL — ABNORMAL HIGH (ref 70–99)

## 2021-09-18 MED ORDER — DEXTROSE IN LACTATED RINGERS 5 % IV SOLN: INTRAVENOUS | Status: AC

## 2021-09-18 MED ORDER — POTASSIUM CHLORIDE 10 MEQ/100ML IV SOLN
10.0000 meq | INTRAVENOUS | Status: AC
  Administered 2021-09-18 (×4): 10 meq via INTRAVENOUS
  Filled 2021-09-18 (×4): qty 100

## 2021-09-18 MED ORDER — INSULIN ASPART 100 UNIT/ML IJ SOLN: 0.0000 [IU] | Freq: Every day | INTRAMUSCULAR | Status: DC

## 2021-09-18 MED ORDER — INSULIN GLARGINE-YFGN 100 UNIT/ML ~~LOC~~ SOLN
5.0000 [IU] | Freq: Every day | SUBCUTANEOUS | Status: DC
  Administered 2021-09-18 – 2021-09-20 (×3): 5 [IU] via SUBCUTANEOUS
  Filled 2021-09-18 (×5): qty 0.05

## 2021-09-18 MED ORDER — POTASSIUM CHLORIDE CRYS ER 20 MEQ PO TBCR
40.0000 meq | EXTENDED_RELEASE_TABLET | ORAL | Status: AC
Start: 2021-09-18 — End: 2021-09-18
  Administered 2021-09-18: 40 meq via ORAL
  Filled 2021-09-18: qty 2

## 2021-09-18 MED ORDER — GUAIFENESIN 100 MG/5ML PO LIQD
5.0000 mL | Freq: Four times a day (QID) | ORAL | Status: DC | PRN
  Administered 2021-09-18 – 2021-09-21 (×4): 5 mL via ORAL
  Filled 2021-09-18 (×4): qty 5

## 2021-09-18 MED ORDER — CALCIUM GLUCONATE-NACL 1-0.675 GM/50ML-% IV SOLN
1.0000 g | INTRAVENOUS | Status: AC
  Administered 2021-09-18: 1000 mg via INTRAVENOUS
  Filled 2021-09-18: qty 50

## 2021-09-18 MED ORDER — INSULIN REGULAR(HUMAN) IN NACL 100-0.9 UT/100ML-% IV SOLN: INTRAVENOUS | Status: DC

## 2021-09-18 MED ORDER — INSULIN ASPART 100 UNIT/ML IJ SOLN
0.0000 [IU] | Freq: Three times a day (TID) | INTRAMUSCULAR | Status: DC
  Administered 2021-09-18: 1 [IU] via SUBCUTANEOUS
  Administered 2021-09-19: 2 [IU] via SUBCUTANEOUS
  Administered 2021-09-19: 1 [IU] via SUBCUTANEOUS
  Administered 2021-09-20: 2 [IU] via SUBCUTANEOUS
  Administered 2021-09-20: 3 [IU] via SUBCUTANEOUS
  Administered 2021-09-21: 1 [IU] via SUBCUTANEOUS

## 2021-09-18 NOTE — Progress Notes (Signed)
Inpatient Diabetes Program Recommendations  AACE/ADA: New Consensus Statement on Inpatient Glycemic Control (2015)  Target Ranges:  Prepandial:   less than 140 mg/dL      Peak postprandial:   less than 180 mg/dL (1-2 hours)      Critically ill patients:  140 - 180 mg/dL   Lab Results  Component Value Date   GLUCAP 138 (H) 09/18/2021   HGBA1C 7.7 (H) 09/17/2021    Review of Glycemic Control  Latest Reference Range & Units 09/18/21 06:46 09/18/21 07:51 09/18/21 09:11 09/18/21 10:36  Glucose-Capillary 70 - 99 mg/dL 121 (H) 135 (H) 152 (H) 138 (H)  (H): Data is abnormally high  Latest Reference Range & Units 09/17/21 23:21 09/18/21 14:27  BASIC METABOLIC PANEL  Rpt ! Rpt !!  Sodium 135 - 145 mmol/L 134 (L) 142  Potassium 3.5 - 5.1 mmol/L 4.0 2.5 (LL)  Chloride 98 - 111 mmol/L 107 120 (H)  CO2 22 - 32 mmol/L 14 (L) 12 (L)  Glucose 70 - 99 mg/dL 154 (H) 107 (H)  BUN 8 - 23 mg/dL 20 13  Creatinine 0.61 - 1.24 mg/dL 0.89 0.51 (L)  Calcium 8.9 - 10.3 mg/dL 9.3 6.3 (LL)  Anion gap 5 - 15  13 10      Diabetes history: DM 2 Outpatient Diabetes medications: Tresiba 40 units Daily, Ozempic 1 mg QSaturday, Jardiance 25 mg Daily, Metformin 1000 mg bid Current orders for Inpatient glycemic control:  IV insulin/Endotool/DKA- transition to Semglee 5 units QD, Novolog 0-9 units TID & HS   On Jardiance at home precipitating DKA A1c almost at goal at 7.7% 2/8   Inpatient Diabetes Program Recommendations:     Noted AM potassium 2.5 with plan for replacement and transition.   At this time, consider:  -Stopping IV insulin, replace K+ -Repeating BMET Q4H and Beta hydroxybutyric acid -Restarting IV insulin and D5% once potassium improves; as CO2 12 and was on Jardiance outpatient  Secure chat sent to Dr Nevada Crane.   Thanks, Bronson Curb, MSN, RNC-OB Diabetes Coordinator [phone] (8a-5p)

## 2021-09-18 NOTE — TOC Initial Note (Signed)
Transition of Care [facility]) - Initial/Assessment Note    Patient Details  Name: Willie [name] MRN: [id number] Date of Birth: [birth year]  Transition of [facility]) CM/SW Contact:    Verdell Carmine, RN Phone Number: 09/18/2021, 5:50 PM  Clinical Narrative:                    The Transition of Care Department [place]) has reviewed patient and no TOC needs have been identified at this time. We will continue to monitor patient advancement through interdisciplinary progression rounds. If new patient transition needs arise, please place a TOC consult      Patient Goals and CMS Choice        Expected Discharge Plan and Services                                                Prior Living Arrangements/Services                       Activities of Daily Living      Permission Sought/Granted                  Emotional Assessment              Admission diagnosis:  Metabolic acidosis [Q30.09] Weakness [R53.1] Hyperglycemia [R73.9] Sepsis ([place]) [A41.9] Multifocal pneumonia [J18.9] Sepsis, due to unspecified organism, unspecified whether acute organ dysfunction present [facility]) [A41.9] Patient Active Problem List   Diagnosis Date Noted   Sepsis ([place]) 09/17/2021   UTI (urinary tract infection) 09/17/2021   Ileus ([place]) 09/17/2021   Rapid atrial fibrillation ([place]) 09/17/2021   CAP (community acquired pneumonia) 09/16/2021   Severe sepsis ([place]) 09/16/2021   DKA, type 2, not at goal [facility]) 09/16/2021   Encounter for general adult medical examination without abnormal findings 11/27/2020   History of adenomatous polyp of colon 11/27/2020   Mild major depression, single episode ([place]) 11/27/2020   Diabetic neuropathy with neurologic complication ([place]) 23/30/0762   Type II diabetes mellitus with ophthalmic manifestations ([place]) 01/19/2020   Hypertension 01/19/2020   Hyperlipemia 01/19/2020   Atherosclerosis of aorta ([place]) 01/19/2020   Dysphagia 01/19/2020    Colonic polyp 01/19/2020   Tinnitus 12/08/2019   Long term (current) use of insulin ([place]) 08/25/2017   Polyneuropathy due to type 2 diabetes mellitus ([place]) 04/08/2011   PCP:  Ginger Organ., MD Pharmacy:   [id number] - [place], [place] [address] [place] [address] Phone: [phone] Fax: [phone]     Social Determinants of Health (SDOH) Interventions    Readmission Risk Interventions No flowsheet data found.

## 2021-09-18 NOTE — Progress Notes (Signed)
PROGRESS NOTE  Willie [name] QAS:[id number] DOB: 03-22-1956 DOA: 09/16/2021 PCP: Ginger Organ., MD  HPI/Recap of past 24 hours:  Patient was seen and examined in his visit.  He was admitted for DKA type II.  Associated with anion gap metabolic acidosis.  He is currently on DKA protocol, insulin drip and IV fluid hydration.  BMP every 4 hours.  Beta hydroxybutyrate acid is downtrending.  We will transition to subcu insulin when indicated.  09/18/21:  Seen and examined at bedside.  No abd pain.  No nausea.    Assessment/Plan: Active Problems:   Diabetic neuropathy with neurologic complication (HCC)   Hypertension   Hyperlipemia   CAP (community acquired pneumonia)   Severe sepsis ([place] [place])   DKA, type 2, not at goal [facility])   UTI (urinary tract infection)   Ileus ([employer])   Rapid atrial fibrillation ([place])  CAP (community acquired pneumonia)  - -Patient presenting with fatigue and infiltrate on chest x-ray -Infiltrate on CXR and 2-3 characteristics (fever, leukocytosis, purulent sputum) are consistent with pneumonia. Rocephin 2g daily  Procalcitonin down trending  DKA type 2  Transitioned to SQ insulin Lantus and ISS A1C 7.7 on 09/17/21  New Afib with RVR CHADSVASC 4 Rate is now controlled after brief cardizem infusion Started on eliquis 2/9 for secondary CVA prevention Follow 2D echo  Resolving high anion metabolic acidosis Resolving Continue IV fluid hydration  Severe hypokalemia K 2.5 Repleted orally and IV   Hypertension BP stable Monitor vital signs   Hyperlipemia Continue home meds   Severe sepsis (HCC)  -SIRS criteria met with  elevated white blood cell count,     Hyperbilirubinemia Tbili 1.6 Repeat CMP AM  Physical debility PT OT assessment Fall precautions  Critical care time: 65 minutes   Code Status: Full   Family Communication: Wife at bedside   Disposition Plan: Likely dc to home tomorrow 09/19/21    Consultants: none   Procedures: none    Antimicrobials: Rocephin azithromycin   DVT prophylaxis:  eliquis  Status is: Inpatient        Objective: Vitals:   09/18/21 1600 09/18/21 1611 09/18/21 1630 09/18/21 1748  BP: 122/63  129/67 131/69  Pulse: 79  76 73  Resp: (!) 23  (!) 23   Temp:    98.4 F (36.9 C)  TempSrc:    Oral  SpO2: 97%  97% 96%  Weight:  72.6 kg    Height:  $Remove'5\' 11"'gRUXhPt$  (1.803 m)      Intake/Output Summary (Last 24 hours) at 09/18/2021 1818 Last data filed at 09/18/2021 1754 Gross per 24 hour  Intake 254.21 ml  Output 900 ml  Net -645.79 ml   Filed Weights   09/16/21 2221 09/18/21 1611  Weight: 74.4 kg 72.6 kg    Exam:  General: 66 y.o. year-old male well developed well nourished in no acute distress.  Alert and oriented x3. Cardiovascular: Regular rate and rhythm with no rubs or gallops.  No thyromegaly or JVD noted.   Respiratory: Clear to auscultation with no wheezes or rales. Good inspiratory effort. Abdomen: Soft nontender nondistended with normal bowel sounds x4 quadrants. Musculoskeletal: No lower extremity edema. 2/4 pulses in all 4 extremities. Skin: No ulcerative lesions noted or rashes, Psychiatry: Mood is appropriate for condition and setting Neuro: Moves all 4 extremities, nonfocal exam.   Data Reviewed: CBC: Recent Labs  Lab 09/16/21 1547 09/16/21 2237 09/17/21 0105 09/17/21 0357 09/17/21 1903 09/18/21 0910  WBC 22.3*  --  16.9*  --   --  14.6*  NEUTROABS 20.2*  --  15.4*  --   --  11.2*  HGB 17.0 17.3* 14.5 14.6 13.6 13.0  HCT 51.2 51.0 44.8 43.0 40.0 38.5*  MCV 92.1  --  92.8  --   --  89.1  PLT 397  --  294  --   --  191   Basic Metabolic Panel: Recent Labs  Lab 09/17/21 0105 09/17/21 0339 09/17/21 1847 09/17/21 1903 09/17/21 2308 09/17/21 2321 09/18/21 0910 09/18/21 1138 09/18/21 1506  NA 132*   < > 136 139  --  134* 142 138 134*  K 4.7   < > 4.0 4.1  --  4.0 2.5* 3.2* 3.7  CL 102   < > 111  --   --  107 120* 108 106  CO2 7*   < > 14*  --   --   14* 12* 18* 20*  GLUCOSE 280*   < > 128*  --   --  154* 107* 143* 195*  BUN 45*   < > 21  --   --  $R'20 13 17 15  'DT$ CREATININE 1.58*   < > 0.88  --   --  0.89 0.51* 0.80 0.75  CALCIUM 9.0   < > 9.2  --   --  9.3 6.3* 9.4 9.1  MG 2.8*  --   --   --  2.5*  --   --   --   --   PHOS 3.4  --   --   --   --   --   --   --   --    < > = values in this interval not displayed.   GFR: Estimated Creatinine Clearance: 92 mL/min (by C-G formula based on SCr of 0.75 mg/dL). Liver Function Tests: Recent Labs  Lab 09/16/21 1547 09/17/21 0105  AST 12* 14*  ALT 16 13  ALKPHOS 119 95  BILITOT 1.1 1.6*  PROT 8.7* 6.7  ALBUMIN 4.1 2.9*   Recent Labs  Lab 09/16/21 2229  LIPASE 56*   No results for input(s): AMMONIA in the last 168 hours. Coagulation Profile: Recent Labs  Lab 09/16/21 1547  INR 1.2   Cardiac Enzymes: Recent Labs  Lab 09/17/21 0105  CKTOTAL 59   BNP (last 3 results) No results for input(s): PROBNP in the last 8760 hours. HbA1C: Recent Labs    09/17/21 0339  HGBA1C 7.7*   CBG: Recent Labs  Lab 09/18/21 0911 09/18/21 1036 09/18/21 1239 09/18/21 1420 09/18/21 1746  GLUCAP 152* 138* 163* 197* 126*   Lipid Profile: No results for input(s): CHOL, HDL, LDLCALC, TRIG, CHOLHDL, LDLDIRECT in the last 72 hours. Thyroid Function Tests: Recent Labs    09/17/21 0105  TSH 0.451   Anemia Panel: No results for input(s): VITAMINB12, FOLATE, FERRITIN, TIBC, IRON, RETICCTPCT in the last 72 hours. Urine analysis:    Component Value Date/Time   COLORURINE YELLOW 09/16/2021 2015   APPEARANCEUR HAZY (A) 09/16/2021 2015   LABSPEC 1.022 09/16/2021 2015   PHURINE 5.0 09/16/2021 2015   GLUCOSEU >=500 (A) 09/16/2021 2015   HGBUR MODERATE (A) 09/16/2021 2015   BILIRUBINUR NEGATIVE 09/16/2021 2015   KETONESUR 80 (A) 09/16/2021 2015   PROTEINUR 100 (A) 09/16/2021 2015   NITRITE NEGATIVE 09/16/2021 2015   LEUKOCYTESUR NEGATIVE 09/16/2021 2015   Sepsis  Labs: $Remo'@[id number]'xhOOq$ (procalcitonin:4,lacticidven:4)  ) Recent Results (from the past 240 hour(s))  Urine Culture     Status: None   Collection Time: 09/16/21  3:29 PM   Specimen: In/Out Cath Urine  Result Value Ref Range Status   Specimen Description IN/OUT CATH URINE  Final   Special Requests NONE  Final   Culture   Final    NO GROWTH Performed at [facility], [address] [place], [place] [address]    Report Status 09/18/2021 FINAL  Final  Resp Panel by RT-PCR (Flu A&B, Covid) Peripheral     Status: None   Collection Time: 09/16/21  3:30 PM   Specimen: Peripheral; Nasopharyngeal(NP) swabs in vial transport medium  Result Value Ref Range Status   SARS Coronavirus 2 by RT PCR NEGATIVE NEGATIVE Final    Comment: (NOTE) SARS-CoV-2 target nucleic acids are NOT DETECTED.  The SARS-CoV-2 RNA is generally detectable in upper respiratory specimens during the acute phase of infection. The lowest concentration of SARS-CoV-2 viral copies this assay can detect is 138 copies/mL. A negative result does not preclude SARS-Cov-2 infection and should not be used as the sole basis for treatment or other patient management decisions. A negative result may occur with  improper specimen collection/handling, submission of specimen other than nasopharyngeal swab, presence of viral mutation(s) within the areas targeted by this assay, and inadequate number of viral copies(<138 copies/mL). A negative result must be combined with clinical observations, patient history, and epidemiological information. The expected result is Negative.  Fact Sheet for Patients:  EntrepreneurPulse.com.au  Fact Sheet for Healthcare Providers:  IncredibleEmployment.be  This test is no t yet approved or cleared by the [place] FDA and  has been authorized for detection and/or diagnosis of SARS-CoV-2 by FDA under an Emergency Use Authorization (EUA). This EUA will remain  in  effect (meaning this test can be used) for the duration of the COVID-19 declaration under Section 564(b)(1) of the Act, 21 U.S.C.section 360bbb-3(b)(1), unless the authorization is terminated  or revoked sooner.       Influenza A by PCR NEGATIVE NEGATIVE Final   Influenza B by PCR NEGATIVE NEGATIVE Final    Comment: (NOTE) The Xpert Xpress SARS-CoV-2/FLU/RSV plus assay is intended as an aid in the diagnosis of influenza from Nasopharyngeal swab specimens and should not be used as a sole basis for treatment. Nasal washings and aspirates are unacceptable for Xpert Xpress SARS-CoV-2/FLU/RSV testing.  Fact Sheet for Patients: EntrepreneurPulse.com.au  Fact Sheet for Healthcare Providers: IncredibleEmployment.be  This test is not yet approved or cleared by the [place] FDA and has been authorized for detection and/or diagnosis of SARS-CoV-2 by FDA under an Emergency Use Authorization (EUA). This EUA will remain in effect (meaning this test can be used) for the duration of the COVID-19 declaration under Section 564(b)(1) of the Act, 21 U.S.C. section 360bbb-3(b)(1), unless the authorization is terminated or revoked.  Performed at [facility], [place] [address], [place], [place] [address]   Respiratory (~20 pathogens) panel by PCR     Status: Abnormal   Collection Time: 09/16/21  3:30 PM   Specimen: Nasopharyngeal Swab; Respiratory  Result Value Ref Range Status   Adenovirus NOT DETECTED NOT DETECTED Final   Coronavirus 229E NOT DETECTED NOT DETECTED Final    Comment: (NOTE) The Coronavirus on the Respiratory Panel, DOES NOT test for the novel  Coronavirus (2019 nCoV)    Coronavirus HKU1 NOT DETECTED NOT DETECTED Final   Coronavirus NL63 NOT DETECTED NOT DETECTED Final   Coronavirus OC43 NOT DETECTED NOT DETECTED Final   Metapneumovirus NOT DETECTED NOT DETECTED Final   Rhinovirus / Enterovirus DETECTED (  A) NOT DETECTED Final    Influenza A NOT DETECTED NOT DETECTED Final   Influenza B NOT DETECTED NOT DETECTED Final   Parainfluenza Virus 1 NOT DETECTED NOT DETECTED Final   Parainfluenza Virus 2 NOT DETECTED NOT DETECTED Final   Parainfluenza Virus 3 NOT DETECTED NOT DETECTED Final   Parainfluenza Virus 4 NOT DETECTED NOT DETECTED Final   Respiratory Syncytial Virus NOT DETECTED NOT DETECTED Final   Bordetella pertussis NOT DETECTED NOT DETECTED Final   Bordetella Parapertussis NOT DETECTED NOT DETECTED Final   Chlamydophila pneumoniae NOT DETECTED NOT DETECTED Final   Mycoplasma pneumoniae NOT DETECTED NOT DETECTED Final    Comment: Performed at [facility], [place] [address], [place], [place] [address]  Blood Culture (routine x 2)     Status: None (Preliminary result)   Collection Time: 09/16/21  3:47 PM   Specimen: BLOOD  Result Value Ref Range Status   Specimen Description BLOOD SITE NOT SPECIFIED  Final   Special Requests   Final    BOTTLES DRAWN AEROBIC AND ANAEROBIC Blood Culture adequate volume   Culture   Final    NO GROWTH 2 DAYS Performed at [facility], [address] [place], [place] [address]    Report Status PENDING  Incomplete  Blood Culture (routine x 2)     Status: None (Preliminary result)   Collection Time: 09/16/21  7:58 PM   Specimen: BLOOD RIGHT ARM  Result Value Ref Range Status   Specimen Description BLOOD RIGHT ARM  Final   Special Requests   Final    BOTTLES DRAWN AEROBIC AND ANAEROBIC Blood Culture adequate volume   Culture   Final    NO GROWTH 2 DAYS Performed at [facility], [place] [address] [place], [place] [address]    Report Status PENDING  Incomplete      Studies: No results found.  Scheduled Meds:  apixaban  5 mg Oral BID   aspirin EC  81 mg Oral Daily   atorvastatin  40 mg Oral Daily   DULoxetine  60 mg Oral Daily   gabapentin  300 mg Oral TID   insulin aspart  0-5 Units Subcutaneous QHS   insulin aspart  0-9 Units Subcutaneous TID WC    insulin glargine-yfgn  5 Units Subcutaneous Daily    Continuous Infusions:  cefTRIAXone (ROCEPHIN)  IV Stopped (09/18/21 1649)   lactated ringers 125 mL/hr at 09/18/21 1757   potassium chloride Stopped (09/18/21 1559)     LOS: 1 day     Kayleen Memos, MD [facility] Pager [phone]  If 7PM-7AM, please contact night-coverage [email or web address] Password [email or web address] 09/18/2021, 6:18 PM

## 2021-09-19 ENCOUNTER — Telehealth: Payer: Self-pay | Admitting: Cardiology

## 2021-09-19 ENCOUNTER — Other Ambulatory Visit (HOSPITAL_COMMUNITY): Payer: Self-pay

## 2021-09-19 LAB — BASIC METABOLIC PANEL
Anion gap: 12 (ref 5–15)
BUN: 15 mg/dL (ref 8–23)
CO2: 20 mmol/L — ABNORMAL LOW (ref 22–32)
Calcium: 9.2 mg/dL (ref 8.9–10.3)
Chloride: 103 mmol/L (ref 98–111)
Creatinine, Ser: 0.74 mg/dL (ref 0.61–1.24)
GFR, Estimated: >60 mL/min (ref >60)
Glucose, Bld: 138 mg/dL — ABNORMAL HIGH (ref 70–99)
Potassium: 3.9 mmol/L (ref 3.5–5.1)
Sodium: 135 mmol/L (ref 135–145)

## 2021-09-19 LAB — CBC WITH DIFFERENTIAL/PLATELET
Abs Immature Granulocytes: 0.77 10*3/uL — ABNORMAL HIGH (ref 0.00–0.07)
Basophils Absolute: 0.1 10*3/uL (ref 0.0–0.1)
Basophils Relative: 1 %
Eosinophils Absolute: 0.1 10*3/uL (ref 0.0–0.5)
Eosinophils Relative: 1 %
HCT: 39.6 % (ref 39.0–52.0)
Hemoglobin: 14 g/dL (ref 13.0–17.0)
Immature Granulocytes: 7 %
Lymphocytes Relative: 8 %
Lymphs Abs: 0.9 10*3/uL (ref 0.7–4.0)
MCH: 31 pg (ref 26.0–34.0)
MCHC: 35.4 g/dL (ref 30.0–36.0)
MCV: 87.8 fL (ref 80.0–100.0)
Monocytes Absolute: 1 10*3/uL (ref 0.1–1.0)
Monocytes Relative: 9 %
Neutro Abs: 8.9 10*3/uL — ABNORMAL HIGH (ref 1.7–7.7)
Neutrophils Relative %: 74 %
Platelets: 242 10*3/uL (ref 150–400)
RBC: 4.51 MIL/uL (ref 4.22–5.81)
RDW: 13.3 % (ref 11.5–15.5)
Smear Review: ADEQUATE
WBC: 11.7 10*3/uL — ABNORMAL HIGH (ref 4.0–10.5)
nRBC: 0 % (ref 0.0–0.2)

## 2021-09-19 LAB — GLUCOSE, CAPILLARY
Glucose-Capillary: 133 mg/dL — ABNORMAL HIGH (ref 70–99)
Glucose-Capillary: 165 mg/dL — ABNORMAL HIGH (ref 70–99)
Glucose-Capillary: 133 mg/dL — ABNORMAL HIGH (ref 70–99)
Glucose-Capillary: 151 mg/dL — ABNORMAL HIGH (ref 70–99)

## 2021-09-19 LAB — PHOSPHORUS: Phosphorus: 3 mg/dL (ref 2.5–4.6)

## 2021-09-19 LAB — PROCALCITONIN: Procalcitonin: 0.53 ng/mL

## 2021-09-19 LAB — BRAIN NATRIURETIC PEPTIDE: B Natriuretic Peptide: 151.1 pg/mL — ABNORMAL HIGH (ref 0.0–100.0)

## 2021-09-19 LAB — MAGNESIUM: Magnesium: 2.1 mg/dL (ref 1.7–2.4)

## 2021-09-19 LAB — LACTIC ACID, PLASMA: Lactic Acid, Venous: 1 mmol/L (ref 0.5–1.9)

## 2021-09-19 MED ORDER — METOPROLOL TARTRATE 12.5 MG HALF TABLET
12.5000 mg | ORAL_TABLET | Freq: Two times a day (BID) | ORAL | Status: DC
  Administered 2021-09-19 – 2021-09-21 (×5): 12.5 mg via ORAL
  Filled 2021-09-19 (×5): qty 1

## 2021-09-19 MED ORDER — LOPERAMIDE HCL 2 MG PO CAPS: 2.0000 mg | ORAL_CAPSULE | ORAL | Status: DC | PRN

## 2021-09-19 MED ORDER — BENZONATATE 100 MG PO CAPS
200.0000 mg | ORAL_CAPSULE | Freq: Three times a day (TID) | ORAL | Status: DC
  Administered 2021-09-19 – 2021-09-20 (×3): 200 mg via ORAL
  Filled 2021-09-19 (×3): qty 2

## 2021-09-19 MED ORDER — LACTATED RINGERS IV SOLN: INTRAVENOUS | Status: AC

## 2021-09-19 NOTE — Progress Notes (Signed)
PROGRESS NOTE  Willie [name] CQP:[id number] DOB: 20-Mar-1956 DOA: 09/16/2021 PCP: Cleatis Polka., MD  HPI/Recap of past 24 hours:  Patient was seen and examined in his visit.  He was admitted for DKA type II.  Associated with anion gap metabolic acidosis.  He is currently on DKA protocol, insulin drip and IV fluid hydration.  BMP every 4 hours.  Beta hydroxybutyrate acid is downtrending.  We will transition to subcu insulin when indicated.  09/19/21: Patient was seen and examined at his bedside.  His wife was present in the room.  He has some mild left lower quadrant abdominal pain with moderate palpation.  Endorses watery stools x6.  Afebrile with no leukocytosis.    Assessment/Plan: Active Problems:   Diabetic neuropathy with neurologic complication (HCC)   Hypertension   Hyperlipemia   CAP (community acquired pneumonia)   Severe sepsis (HCC)   DKA, type 2, not at goal [facility])   UTI (urinary tract infection)   Ileus (HCC)   Rapid atrial fibrillation (HCC)  CAP (community acquired pneumonia)  - -Patient presenting with fatigue and infiltrate on chest x-ray -Infiltrate on CXR and 2-3 characteristics (fever, leukocytosis, purulent sputum) are consistent with pneumonia. Rocephin 2g daily  Procalcitonin down trending  DKA type 2  Transitioned to SQ insulin Lantus and ISS A1C 7.7 on 09/17/21  Mild anion gap metabolic acidosis Serum bicarb 20, anion gap 12 Continue to monitor for now  New Afib with RVR CHADSVASC 4 Rate is now controlled after brief cardizem infusion Started on eliquis 2/9 for secondary CVA prevention Follow 2D echo Will follow-up with cardiology outpatient, Dr. Nadara Eaton.  Diarrhea, unclear etiology. Afebrile no leukocytosis Start Imodium  Resolving high anion metabolic acidosis Resolving Continue IV fluid hydration  Severe hypokalemia K 2.5 Repleted orally and IV   Hypertension BP stable Monitor vital signs   Hyperlipemia Continue home meds    Severe sepsis (HCC)  -SIRS criteria met with  elevated white blood cell count,     Hyperbilirubinemia Tbili 1.6 Repeat CMP AM  Physical debility PT OT assessment, no PT follow-up Fall precautions     Code Status: Full   Family Communication: Wife at bedside   Disposition Plan: Likely dc to home tomorrow 09/20/21    Consultants: none   Procedures: none   Antimicrobials: Rocephin azithromycin   DVT prophylaxis:  eliquis  Status is: Inpatient        Objective: Vitals:   09/19/21 0553 09/19/21 0739 09/19/21 1151 09/19/21 1549  BP: (!) 146/79 (!) 141/75 (!) 117/105 127/62  Pulse: 73 73 85 85  Resp: 18 (!) 24 (!) 28 (!) 25  Temp:  98.4 F (36.9 C) 98.4 F (36.9 C) 99.1 F (37.3 C)  TempSrc:  Oral Oral Oral  SpO2: 96% 94% 93% 93%  Weight:      Height:        Intake/Output Summary (Last 24 hours) at 09/19/2021 1741 Last data filed at 09/19/2021 1604 Gross per 24 hour  Intake 1970.9 ml  Output 1900 ml  Net 70.9 ml   Filed Weights   09/16/21 2221 09/18/21 1611  Weight: 74.4 kg 72.6 kg    Exam:  General: 66 y.o. year-old male well-developed well-nourished in no acute distress.  He is alert oriented x3.   Cardiovascular: Regular rate and rhythm no rubs gallops. Respiratory: Clear to auscultation no wheeze or rales.   Abdomen: Soft minimally tender in left lower quadrant with moderate palpation.  Musculoskeletal: No lower extremity edema bilaterally.  Skin: No ulcerative lesions noted or rashes, Psychiatry: Mood is appropriate for condition and setting Neuro: Moves all 4 extremities, nonfocal exam.   Data Reviewed: CBC: Recent Labs  Lab 09/16/21 1547 09/16/21 2237 09/17/21 0105 09/17/21 0357 09/17/21 1903 09/18/21 0910 09/19/21 0543  WBC 22.3*  --  16.9*  --   --  14.6* 11.7*  NEUTROABS 20.2*  --  15.4*  --   --  11.2* 8.9*  HGB 17.0   < > 14.5 14.6 13.6 13.0 14.0  HCT 51.2   < > 44.8 43.0 40.0 38.5* 39.6  MCV 92.1  --  92.8  --   --   89.1 87.8  PLT 397  --  294  --   --  253 242   < > = values in this interval not displayed.   Basic Metabolic Panel: Recent Labs  Lab 09/17/21 0105 09/17/21 0339 09/17/21 2308 09/17/21 2321 09/18/21 0910 09/18/21 1138 09/18/21 1506 09/18/21 1836 09/19/21 0140 09/19/21 0543  NA 132*   < >  --    < > 142 138 134* 136 135  --   K 4.7   < >  --    < > 2.5* 3.2* 3.7 3.7 3.9  --   CL 102   < >  --    < > 120* 108 106 105 103  --   CO2 7*   < >  --    < > 12* 18* 20* 19* 20*  --   GLUCOSE 280*   < >  --    < > 107* 143* 195* 136* 138*  --   BUN 45*   < >  --    < > $R'13 17 15 16 15  'iP$ --   CREATININE 1.58*   < >  --    < > 0.51* 0.80 0.75 0.72 0.74  --   CALCIUM 9.0   < >  --    < > 6.3* 9.4 9.1 9.3 9.2  --   MG 2.8*  --  2.5*  --   --   --   --   --   --  2.1  PHOS 3.4  --   --   --   --   --   --   --   --  3.0   < > = values in this interval not displayed.   GFR: Estimated Creatinine Clearance: 92 mL/min (by C-G formula based on SCr of 0.74 mg/dL). Liver Function Tests: Recent Labs  Lab 09/16/21 1547 09/17/21 0105  AST 12* 14*  ALT 16 13  ALKPHOS 119 95  BILITOT 1.1 1.6*  PROT 8.7* 6.7  ALBUMIN 4.1 2.9*   Recent Labs  Lab 09/16/21 2229  LIPASE 56*   No results for input(s): AMMONIA in the last 168 hours. Coagulation Profile: Recent Labs  Lab 09/16/21 1547  INR 1.2   Cardiac Enzymes: Recent Labs  Lab 09/17/21 0105  CKTOTAL 59   BNP (last 3 results) No results for input(s): PROBNP in the last 8760 hours. HbA1C: Recent Labs    09/17/21 0339  HGBA1C 7.7*   CBG: Recent Labs  Lab 09/18/21 1746 09/18/21 2059 09/19/21 0738 09/19/21 1149 09/19/21 1547  GLUCAP 126* 132* 133* 165* 133*   Lipid Profile: No results for input(s): CHOL, HDL, LDLCALC, TRIG, CHOLHDL, LDLDIRECT in the last 72 hours. Thyroid Function Tests: Recent Labs    09/17/21 0105  TSH 0.451   Anemia Panel: No results for  input(s): VITAMINB12, FOLATE, FERRITIN, TIBC, IRON, RETICCTPCT  in the last 72 hours. Urine analysis:    Component Value Date/Time   COLORURINE YELLOW 09/16/2021 2015   APPEARANCEUR HAZY (A) 09/16/2021 2015   LABSPEC 1.022 09/16/2021 2015   PHURINE 5.0 09/16/2021 2015   GLUCOSEU >=500 (A) 09/16/2021 2015   HGBUR MODERATE (A) 09/16/2021 2015   [place] NEGATIVE 09/16/2021 2015   KETONESUR 80 (A) 09/16/2021 2015   PROTEINUR 100 (A) 09/16/2021 2015   NITRITE NEGATIVE 09/16/2021 2015   LEUKOCYTESUR NEGATIVE 09/16/2021 2015   Sepsis Labs: $RemoveBefo'@LABRCNTIP'KjcrpAuGysj$ (procalcitonin:4,lacticidven:4)  ) Recent Results (from the past 240 hour(s))  Urine Culture     Status: None   Collection Time: 09/16/21  3:29 PM   Specimen: In/Out Cath Urine  Result Value Ref Range Status   Specimen Description IN/OUT CATH URINE  Final   Special Requests NONE  Final   Culture   Final    NO GROWTH Performed at [facility], [place] [address], [place], [place] [address]    Report Status 09/18/2021 FINAL  Final  Resp Panel by RT-PCR (Flu A&B, Covid) Peripheral     Status: None   Collection Time: 09/16/21  3:30 PM   Specimen: Peripheral; Nasopharyngeal(NP) swabs in vial transport medium  Result Value Ref Range Status   SARS Coronavirus 2 by RT PCR NEGATIVE NEGATIVE Final    Comment: (NOTE) SARS-CoV-2 target nucleic acids are NOT DETECTED.  The SARS-CoV-2 RNA is generally detectable in upper respiratory specimens during the acute phase of infection. The lowest concentration of SARS-CoV-2 viral copies this assay can detect is 138 copies/mL. A negative result does not preclude SARS-Cov-2 infection and should not be used as the sole basis for treatment or other patient management decisions. A negative result may occur with  improper specimen collection/handling, submission of specimen other than nasopharyngeal swab, presence of viral mutation(s) within the areas targeted by this assay, and inadequate number of viral copies(<138 copies/mL). A negative result must be combined  with clinical observations, patient history, and epidemiological information. The expected result is Negative.  Fact Sheet for Patients:  EntrepreneurPulse.com.au  Fact Sheet for Healthcare Providers:  IncredibleEmployment.be  This test is no t yet approved or cleared by the [place] FDA and  has been authorized for detection and/or diagnosis of SARS-CoV-2 by FDA under an Emergency Use Authorization (EUA). This EUA will remain  in effect (meaning this test can be used) for the duration of the COVID-19 declaration under Section 564(b)(1) of the Act, 21 U.S.C.section 360bbb-3(b)(1), unless the authorization is terminated  or revoked sooner.       Influenza A by PCR NEGATIVE NEGATIVE Final   Influenza B by PCR NEGATIVE NEGATIVE Final    Comment: (NOTE) The Xpert Xpress SARS-CoV-2/FLU/RSV plus assay is intended as an aid in the diagnosis of influenza from Nasopharyngeal swab specimens and should not be used as a sole basis for treatment. Nasal washings and aspirates are unacceptable for Xpert Xpress SARS-CoV-2/FLU/RSV testing.  Fact Sheet for Patients: EntrepreneurPulse.com.au  Fact Sheet for Healthcare Providers: IncredibleEmployment.be  This test is not yet approved or cleared by the [place] FDA and has been authorized for detection and/or diagnosis of SARS-CoV-2 by FDA under an Emergency Use Authorization (EUA). This EUA will remain in effect (meaning this test can be used) for the duration of the COVID-19 declaration under Section 564(b)(1) of the Act, 21 U.S.C. section 360bbb-3(b)(1), unless the authorization is terminated or revoked.  Performed at [facility],  [address] [place], [place] [address]   Respiratory (~20 pathogens) panel by PCR     Status: Abnormal   Collection Time: 09/16/21  3:30 PM   Specimen: Nasopharyngeal Swab; Respiratory  Result Value Ref Range Status    Adenovirus NOT DETECTED NOT DETECTED Final   Coronavirus 229E NOT DETECTED NOT DETECTED Final    Comment: (NOTE) The Coronavirus on the Respiratory Panel, DOES NOT test for the novel  Coronavirus (2019 nCoV)    Coronavirus HKU1 NOT DETECTED NOT DETECTED Final   Coronavirus NL63 NOT DETECTED NOT DETECTED Final   Coronavirus OC43 NOT DETECTED NOT DETECTED Final   Metapneumovirus NOT DETECTED NOT DETECTED Final   Rhinovirus / Enterovirus DETECTED (A) NOT DETECTED Final   Influenza A NOT DETECTED NOT DETECTED Final   Influenza B NOT DETECTED NOT DETECTED Final   Parainfluenza Virus 1 NOT DETECTED NOT DETECTED Final   Parainfluenza Virus 2 NOT DETECTED NOT DETECTED Final   Parainfluenza Virus 3 NOT DETECTED NOT DETECTED Final   Parainfluenza Virus 4 NOT DETECTED NOT DETECTED Final   Respiratory Syncytial Virus NOT DETECTED NOT DETECTED Final   Bordetella pertussis NOT DETECTED NOT DETECTED Final   Bordetella Parapertussis NOT DETECTED NOT DETECTED Final   Chlamydophila pneumoniae NOT DETECTED NOT DETECTED Final   Mycoplasma pneumoniae NOT DETECTED NOT DETECTED Final    Comment: Performed at [facility], [place] [place], [place] [address]  Blood Culture (routine x 2)     Status: None (Preliminary result)   Collection Time: 09/16/21  3:47 PM   Specimen: BLOOD  Result Value Ref Range Status   Specimen Description BLOOD SITE NOT SPECIFIED  Final   Special Requests   Final    BOTTLES DRAWN AEROBIC AND ANAEROBIC Blood Culture adequate volume   Culture   Final    NO GROWTH 3 DAYS Performed at [facility], [address] [place], [place] [address]    Report Status PENDING  Incomplete  Blood Culture (routine x 2)     Status: None (Preliminary result)   Collection Time: 09/16/21  7:58 PM   Specimen: BLOOD RIGHT ARM  Result Value Ref Range Status   Specimen Description BLOOD RIGHT ARM  Final   Special Requests   Final    BOTTLES DRAWN AEROBIC AND ANAEROBIC Blood  Culture adequate volume   Culture   Final    NO GROWTH 3 DAYS Performed at [facility], [place] [address] [place],  [address]    Report Status PENDING  Incomplete      Studies: No results found.  Scheduled Meds:  apixaban  5 mg Oral BID   aspirin EC  81 mg Oral Daily   atorvastatin  40 mg Oral Daily   benzonatate  200 mg Oral TID   DULoxetine  60 mg Oral Daily   gabapentin  300 mg Oral TID   insulin aspart  0-5 Units Subcutaneous QHS   insulin aspart  0-9 Units Subcutaneous TID WC   insulin glargine-yfgn  5 Units Subcutaneous Daily   metoprolol tartrate  12.5 mg Oral BID    Continuous Infusions:  cefTRIAXone (ROCEPHIN)  IV 2 g (09/19/21 1604)     LOS: 2 days     Kayleen Memos, MD [facility] Pager [phone]  If 7PM-7AM, please contact night-coverage [email or web address] Password [facility] 09/19/2021, 5:41 PM

## 2021-09-19 NOTE — Plan of Care (Signed)

## 2021-09-19 NOTE — Progress Notes (Signed)
Pt converted back to a-fib with a HR sustaining in the 140's, pt is asymptomatic, metoprolol given and MD Opyd and charge nurse notified.  MD gave verbal orders to start Cardizem drip if pt HR sustained above 120's, labs, and EKG. I continued to monitor pt for signs of distress and vitals. Pt converted back to NSR.

## 2021-09-19 NOTE — Discharge Instructions (Addendum)

## 2021-09-19 NOTE — Evaluation (Signed)
Physical Therapy Evaluation Patient Details Name: Willie [name] MRN: [id number] DOB: 06/22/1956 Today's Date: 09/19/2021  History of Present Illness  Pt is 66 yo male admitted on 09/16/21 for CAP, DKA,  and new afib with RVR.  Pt with hx including DM, HLD, HTN.  Clinical Impression  Pt admitted with above diagnosis. At baseline, pt is independent and working.  Today, pt requiring min guard to min A for transfers and to ambulate 100' without AD.  He was able to ambulate on RA with stable O2 sats. Will benefit from PT to address mild deficits in balance, mobility, and cardiopulmonary endurance.  Likely no PT needs at d/c. Pt currently with functional limitations due to the deficits listed below (see PT Problem List). Pt will benefit from skilled PT to increase their independence and safety with mobility to allow discharge to the venue listed below.          Recommendations for follow up therapy are one component of a multi-disciplinary discharge planning process, led by the attending physician.  Recommendations may be updated based on patient status, additional functional criteria and insurance authorization.  Follow Up Recommendations No PT follow up    Assistance Recommended at Discharge PRN  Patient can return home with the following  A little help with walking and/or transfers;A little help with bathing/dressing/bathroom;Assistance with cooking/housework    Equipment Recommendations None recommended by PT  Recommendations for Other Services       Functional Status Assessment Patient has had a recent decline in their functional status and demonstrates the ability to make significant improvements in function in a reasonable and predictable amount of time.     Precautions / Restrictions Precautions Precautions: Fall      Mobility  Bed Mobility Overal bed mobility: Needs Assistance Bed Mobility: Supine to Sit, Sit to Supine     Supine to sit: Supervision, HOB elevated Sit to  supine: Supervision, HOB elevated   General bed mobility comments: Use of rail and increased time    Transfers Overall transfer level: Needs assistance Equipment used: None Transfers: Sit to/from Stand Sit to Stand: Min guard           General transfer comment: Min guard for safety    Ambulation/Gait Ambulation/Gait assistance: Min guard, Min assist Gait Distance (Feet): 100 Feet Assistive device: None Gait Pattern/deviations: Step-through pattern, Decreased stride length, Drifts right/left Gait velocity: decreased     General Gait Details: Overall min guard but did have 1 LOB requiring min A; slight drifting in hallway  Stairs            Wheelchair Mobility    Modified Rankin (Stroke Patients Only)       Balance Overall balance assessment: Needs assistance Sitting-balance support: No upper extremity supported Sitting balance-Leahy Scale: Normal     Standing balance support: No upper extremity supported Standing balance-Leahy Scale: Good                               Pertinent Vitals/Pain Pain Assessment Pain Assessment: No/denies pain    Home Living Family/patient expects to be discharged to:: Private residence Living Arrangements: Spouse/significant other Available Help at Discharge: Available 24 hours/day;Family Type of Home: House Home Access: Stairs to enter Entrance Stairs-Rails: None Entrance Stairs-Number of Steps: 2   Home Layout: Multi-level;Able to live on main level with bedroom/bathroom Home Equipment: Cane - single point;Rolling Walker (2 wheels);Rollator (4 wheels)      Prior  Function Prior Level of Function : Independent/Modified Independent;Working/employed;Driving             Mobility Comments: Independent with mobility ADLs Comments: Independent with ADLs, IADLs, and works at [employer]        Extremity/Trunk Assessment   Upper Extremity Assessment Upper Extremity Assessment:  Overall WFL for tasks assessed    Lower Extremity Assessment Lower Extremity Assessment: Overall WFL for tasks assessed    Cervical / Trunk Assessment Cervical / Trunk Assessment: Other exceptions Cervical / Trunk Exceptions: forward head  Communication   Communication: No difficulties  Cognition Arousal/Alertness: Awake/alert Behavior During Therapy: WFL for tasks assessed/performed Overall Cognitive Status: Within Functional Limits for tasks assessed                                 General Comments: Initially seemed to need increased time , but upon further assessment more related to [place] comments (skin integrity, edema, etc.): Pt on RA with sats 92% rest and 91% with ambulation.  Also, discussed use of RW upon initial return home if feeling unsteady still    Exercises Other Exercises Other Exercises: Provided pt with IS and educated on correct use.  Performed x 5 to 1000 mL   Assessment/Plan    PT Assessment Patient needs continued PT services  PT Problem List Decreased mobility;Decreased activity tolerance;Cardiopulmonary status limiting activity;Decreased balance;Decreased knowledge of use of DME       PT Treatment Interventions DME instruction;Therapeutic activities;Gait training;Therapeutic exercise;Patient/family education;Stair training;Balance training;Functional mobility training    PT Goals (Current goals can be found in the Care Plan section)  Acute Rehab PT Goals Patient Stated Goal: return home PT Goal Formulation: With patient/family Time For Goal Achievement: 10/03/21 Potential to Achieve Goals: Good    Frequency Min 3X/week     Co-evaluation               AM-PAC PT "6 Clicks" Mobility  Outcome Measure Help needed turning from your back to your side while in a flat bed without using bedrails?: None Help needed moving from lying on your back to sitting on the side of a flat bed without using  bedrails?: A Little Help needed moving to and from a bed to a chair (including a wheelchair)?: A Little Help needed standing up from a chair using your arms (e.g., wheelchair or bedside chair)?: A Little Help needed to walk in hospital room?: A Little Help needed climbing 3-5 steps with a railing? : A Little 6 Click Score: 19    End of Session Equipment Utilized During Treatment: Gait belt Activity Tolerance: Patient tolerated treatment well Patient left: in bed;with call bell/phone within reach;with family/visitor present;with bed alarm set Nurse Communication: Mobility status PT Visit Diagnosis: Other abnormalities of gait and mobility (R26.89)    Time: 6812-7517 PT Time Calculation (min) (ACUTE ONLY): 27 min   Charges:   PT Evaluation $PT Eval Low Complexity: 1 Low PT Treatments $Gait Training: 8-22 mins        Abran Richard, PT Acute Rehab Services Pager [phone] [facility] [phone]   Karlton Lemon 09/19/2021, 5:24 PM

## 2021-09-19 NOTE — TOC Benefit Eligibility Note (Signed)
Patient Advocate Encounter ° °Insurance verification completed.   ° °The patient is currently admitted and upon discharge could be taking Eliquis 5 mg. ° °The current 30 day co-pay is, $45.00.  ° °The patient is insured through [employer] Medicare Part D  ° ° ° °Evelia Waskey, CPhT °Pharmacy Patient Advocate Specialist °[facility] Patient Advocate Team °Direct Number: [phone]  Fax: [phone] ° ° ° ° ° °  °

## 2021-09-19 NOTE — Plan of Care (Signed)
Willie [name] is a 66 y.o. male patient. 1. Sepsis, due to unspecified organism, unspecified whether acute organ dysfunction present ([place])   2. Weakness   3. Multifocal pneumonia   4. Hyperglycemia   5. Metabolic acidosis    Past Medical History:  Diagnosis Date   Adenomatous polyp    Atherosclerosis of aorta (HCC)    Cataract    Diabetes mellitus without complication (HCC)    Hearing loss, right    Hyperlipidemia    Hypertension    MRSA infection    Current Facility-Administered Medications  Medication Dose Route Frequency Provider Last Rate Last Admin   apixaban (ELIQUIS) tablet 5 mg  5 mg Oral BID Erenest Blank, RPH   5 mg at 09/18/21 2320   aspirin EC tablet 81 mg  81 mg Oral Daily Doutova, Nyoka Lint, MD   81 mg at 09/18/21 1029   atorvastatin (LIPITOR) tablet 40 mg  40 mg Oral Daily Doutova, Anastassia, MD   40 mg at 09/18/21 1029   cefTRIAXone (ROCEPHIN) 2 g in sodium chloride 0.9 % 100 mL IVPB  2 g Intravenous Q24H Irene Pap N, DO   Stopped at 09/18/21 1649   dextrose 50 % solution 0-50 mL  0-50 mL Intravenous PRN Toy Baker, MD       DULoxetine (CYMBALTA) DR capsule 60 mg  60 mg Oral Daily Doutova, Anastassia, MD   60 mg at 09/18/21 1029   gabapentin (NEURONTIN) capsule 300 mg  300 mg Oral TID Toy Baker, MD   300 mg at 09/18/21 2320   guaiFENesin (ROBITUSSIN) 100 MG/5ML liquid 5 mL  5 mL Oral Q6H PRN Opyd, Ilene Qua, MD   5 mL at 09/18/21 2331   insulin aspart (novoLOG) injection 0-5 Units  0-5 Units Subcutaneous QHS Hall, Carole N, DO       insulin aspart (novoLOG) injection 0-9 Units  0-9 Units Subcutaneous TID WC Irene Pap N, DO   1 Units at 09/18/21 1751   insulin glargine-yfgn (SEMGLEE) injection 5 Units  5 Units Subcutaneous Daily Irene Pap N, DO   5 Units at 09/18/21 1228   lactated ringers infusion   Intravenous Continuous Toy Baker, MD 125 mL/hr at 09/18/21 1757 New Bag at 09/18/21 1757   metoprolol tartrate (LOPRESSOR)  injection 5 mg  5 mg Intravenous Q2H PRN Opyd, Ilene Qua, MD   5 mg at 09/17/21 2318   No Known Allergies Active Problems:   Diabetic neuropathy with neurologic complication (HCC)   Hypertension   Hyperlipemia   CAP (community acquired pneumonia)   Severe sepsis ([place])   DKA, type 2, not at goal [facility])   UTI (urinary tract infection)   Ileus ([place])   Rapid atrial fibrillation (HCC)  Blood pressure (!) 155/77, pulse 80, temperature 98.1 F (36.7 C), temperature source Oral, resp. rate 19, height 5\' 11"  (1.803 m), weight 72.6 kg, SpO2 94 %.  Subjective Objective Assessment & Plan  Maxie Barb 09/19/2021

## 2021-09-19 NOTE — Progress Notes (Signed)
Inpatient Diabetes Program Recommendations  AACE/ADA: New Consensus Statement on Inpatient Glycemic Control (2015)  Target Ranges:  Prepandial:   less than 140 mg/dL      Peak postprandial:   less than 180 mg/dL (1-2 hours)      Critically ill patients:  140 - 180 mg/dL   Lab Results  Component Value Date   GLUCAP 133 (H) 09/19/2021   HGBA1C 7.7 (H) 09/17/2021    Review of Glycemic Control  Latest Reference Range & Units 09/18/21 17:46 09/18/21 20:59 09/19/21 07:38  Glucose-Capillary 70 - 99 mg/dL 126 (H) 132 (H) 133 (H)  (H): Data is abnormally high Diabetes history: DM 2 Outpatient Diabetes medications: Tresiba 40 units Daily, Ozempic 1 mg QSaturday, Jardiance 25 mg Daily, Metformin 1000 mg bid Current orders for Inpatient glycemic control:  IV insulin/Endotool/DKA- transition to Semglee 5 units QD, Novolog 0-9 units TID & HS   On Jardiance at home precipitating DKA A1c almost at goal at 7.7% 2/8   Inpatient Diabetes Program Recommendations:     Would not recommend resuming Jardiance at discharge.   Thanks, Bronson Curb, MSN, RNC-OB Diabetes Coordinator [phone] (8a-5p)

## 2021-09-20 ENCOUNTER — Inpatient Hospital Stay (HOSPITAL_COMMUNITY): Payer: HMO

## 2021-09-20 ENCOUNTER — Other Ambulatory Visit (HOSPITAL_COMMUNITY): Payer: HMO

## 2021-09-20 DIAGNOSIS — K3184 Gastroparesis: Secondary | ICD-10-CM

## 2021-09-20 DIAGNOSIS — I4891 Unspecified atrial fibrillation: Secondary | ICD-10-CM

## 2021-09-20 DIAGNOSIS — K2100 Gastro-esophageal reflux disease with esophagitis, without bleeding: Secondary | ICD-10-CM

## 2021-09-20 DIAGNOSIS — R197 Diarrhea, unspecified: Secondary | ICD-10-CM

## 2021-09-20 DIAGNOSIS — R1013 Epigastric pain: Secondary | ICD-10-CM

## 2021-09-20 LAB — BASIC METABOLIC PANEL
Anion gap: 15 (ref 5–15)
BUN: 14 mg/dL (ref 8–23)
CO2: 19 mmol/L — ABNORMAL LOW (ref 22–32)
Calcium: 8.6 mg/dL — ABNORMAL LOW (ref 8.9–10.3)
Chloride: 103 mmol/L (ref 98–111)
Creatinine, Ser: 0.73 mg/dL (ref 0.61–1.24)
GFR, Estimated: >60 mL/min (ref >60)
Glucose, Bld: 122 mg/dL — ABNORMAL HIGH (ref 70–99)
Potassium: 3.4 mmol/L — ABNORMAL LOW (ref 3.5–5.1)
Sodium: 137 mmol/L (ref 135–145)

## 2021-09-20 LAB — CBC
HCT: 36.5 % — ABNORMAL LOW (ref 39.0–52.0)
Hemoglobin: 12.7 g/dL — ABNORMAL LOW (ref 13.0–17.0)
MCH: 30.4 pg (ref 26.0–34.0)
MCHC: 34.8 g/dL (ref 30.0–36.0)
MCV: 87.3 fL (ref 80.0–100.0)
Platelets: 249 10*3/uL (ref 150–400)
RBC: 4.18 MIL/uL — ABNORMAL LOW (ref 4.22–5.81)
RDW: 13 % (ref 11.5–15.5)
WBC: 11 10*3/uL — ABNORMAL HIGH (ref 4.0–10.5)
nRBC: 0 % (ref 0.0–0.2)

## 2021-09-20 LAB — GLUCOSE, CAPILLARY
Glucose-Capillary: 157 mg/dL — ABNORMAL HIGH (ref 70–99)
Glucose-Capillary: 113 mg/dL — ABNORMAL HIGH (ref 70–99)
Glucose-Capillary: 206 mg/dL — ABNORMAL HIGH (ref 70–99)
Glucose-Capillary: 195 mg/dL — ABNORMAL HIGH (ref 70–99)

## 2021-09-20 LAB — ECHOCARDIOGRAM COMPLETE
Area-P 1/2: 3.15 cm2
Height: 71 in
S' Lateral: 3 cm
Weight: 2560 oz

## 2021-09-20 LAB — FERRITIN: Ferritin: 577 ng/mL — ABNORMAL HIGH (ref 24–336)

## 2021-09-20 LAB — IRON AND TIBC
Iron: 77 ug/dL (ref 45–182)
Saturation Ratios: 38 % (ref 17.9–39.5)
TIBC: 202 ug/dL — ABNORMAL LOW (ref 250–450)
UIBC: 125 ug/dL

## 2021-09-20 LAB — PROCALCITONIN: Procalcitonin: 0.31 ng/mL

## 2021-09-20 MED ORDER — BENZONATATE 100 MG PO CAPS
200.0000 mg | ORAL_CAPSULE | Freq: Three times a day (TID) | ORAL | Status: DC
  Administered 2021-09-20 – 2021-09-21 (×3): 200 mg via ORAL
  Filled 2021-09-20 (×3): qty 2

## 2021-09-20 MED ORDER — SODIUM CHLORIDE 0.9 % IV SOLN
2.0000 g | INTRAVENOUS | Status: AC
  Administered 2021-09-20: 2 g via INTRAVENOUS
  Filled 2021-09-20: qty 20

## 2021-09-20 MED ORDER — PANTOPRAZOLE SODIUM 40 MG IV SOLR
40.0000 mg | Freq: Two times a day (BID) | INTRAVENOUS | Status: DC
  Administered 2021-09-20 (×2): 40 mg via INTRAVENOUS
  Filled 2021-09-20 (×2): qty 10

## 2021-09-20 MED ORDER — POTASSIUM CHLORIDE CRYS ER 20 MEQ PO TBCR
40.0000 meq | EXTENDED_RELEASE_TABLET | Freq: Two times a day (BID) | ORAL | Status: AC
  Administered 2021-09-20 (×2): 40 meq via ORAL
  Filled 2021-09-20 (×2): qty 2

## 2021-09-20 MED ORDER — CALCIUM CARBONATE ANTACID 500 MG PO CHEW: 1.0000 | CHEWABLE_TABLET | Freq: Three times a day (TID) | ORAL | Status: DC | PRN

## 2021-09-20 MED ORDER — IPRATROPIUM-ALBUTEROL 0.5-2.5 (3) MG/3ML IN SOLN: 3.0000 mL | Freq: Four times a day (QID) | RESPIRATORY_TRACT | Status: DC

## 2021-09-20 MED ORDER — IPRATROPIUM-ALBUTEROL 0.5-2.5 (3) MG/3ML IN SOLN: 3.0000 mL | Freq: Four times a day (QID) | RESPIRATORY_TRACT | Status: DC | PRN

## 2021-09-20 NOTE — Progress Notes (Signed)
Echocardiogram 2D Echocardiogram has been performed.  Oneal Deputy Emmory Solivan RDCS 09/20/2021, 12:34 PM

## 2021-09-20 NOTE — Consult Note (Signed)
Consult Note   Referring Provider: [facility] Primary Care Physician:  Ginger Organ., MD Primary Gastroenterologist:  Fairmount Cellar, MD  Reason for Consultation: Epigastric pain, diarrhea  HPI: Willie [name] is a 66 y.o. male who is hospitalized with CAP, DKA type II who relates mild epigastric pain with meals since hospitalization and worsening diarrhea since hospitalization.  He relates frequent problems with diarrhea at home however with his acute illness he had multiple episodes of loose stools with incontinence which has improved over the past 1 to 2 days.  He relates mild epigastric pain following meals especially with cold liquids.  GI findings on his CT chest, abdomen, pelvis on February 7 included a small hiatal hernia, patulous esophagus, thickened distal esophageal wall, fluid dilation of the stomach, slight prominent thickened jejunal segments hepatic steatosis, moderate stool retention in the a sending transverse colon.  EGD in July 2021 showed LA Grade A esophagitis.  Colonoscopy in July 2021 showed 1 small adenomatous polyp, mild left colon diverticulosis, internal hemorrhoids and random colon biopsies were negative for microscopic colitis.   Past Medical History:  Diagnosis Date   Adenomatous polyp    Atherosclerosis of aorta (HCC)    Cataract    Diabetes mellitus without complication (HCC)    Hearing loss, right    Hyperlipidemia    Hypertension    MRSA infection     Past Surgical History:  Procedure Laterality Date   Cervical Lymph Node Skin Biopsy Left    COLONOSCOPY     CYST REMOVAL LEG     DG THUMB LEFT HAND     INCISE AND DRAIN ABCESS     thumb   POLYPECTOMY      Prior to Admission medications   Medication Sig Start Date End Date Taking? Authorizing Provider  aspirin 81 MG tablet Take 81 mg by mouth daily.   Yes [provider]  atorvastatin (LIPITOR) 40 MG tablet Take 40 mg by mouth daily.   Yes [provider]  DULoxetine  (CYMBALTA) 60 MG capsule Take 60 mg by mouth daily. 11/10/20  Yes [provider]  empagliflozin (JARDIANCE) 25 MG TABS tablet Take 25 mg by mouth daily.    Yes [provider]  gabapentin (NEURONTIN) 300 MG capsule Take 300 mg by mouth 3 (three) times daily.   Yes [provider]  metFORMIN (GLUCOPHAGE) 1000 MG tablet Take 1,000 mg by mouth 2 (two) times daily with a meal.   Yes [provider]  Multiple Vitamins-Minerals (MULTIVITAMIN GUMMIES ADULTS PO) Take 1 tablet by mouth daily.   Yes [provider]  naproxen (NAPROSYN) 500 MG tablet Take 500 mg by mouth 2 (two) times daily as needed for mild pain.   Yes [provider]  OZEMPIC, 1 MG/DOSE, 2 MG/1.5ML SOPN Inject 1 mg into the skin every Saturday. 12/01/19  Yes [provider]  telmisartan (MICARDIS) 40 MG tablet Take 40 mg by mouth daily.   Yes [provider]  TRESIBA FLEXTOUCH 100 UNIT/ML FlexTouch Pen Inject 40 Units into the skin daily. 08/13/19  Yes [provider]    Current Facility-Administered Medications  Medication Dose Route Frequency Provider Last Rate Last Admin   apixaban (ELIQUIS) tablet 5 mg  5 mg Oral BID Erenest Blank, RPH   5 mg at 09/20/21 0923   aspirin EC tablet 81 mg  81 mg Oral Daily Doutova, Anastassia, MD   81 mg at 09/20/21 0850   atorvastatin (LIPITOR) tablet 40 mg  40 mg Oral Daily Toy Baker, MD   40 mg at 09/20/21 0851   benzonatate (TESSALON) capsule 200 mg  200 mg Oral TID Irene Pap N, DO   200 mg at 09/20/21 6073   cefTRIAXone (ROCEPHIN) 2 g in sodium chloride 0.9 % 100 mL IVPB  2 g Intravenous Q24H Irene Pap N, DO 200 mL/hr at 09/19/21 1604 2 g at 09/19/21 1604   dextrose 50 % solution 0-50 mL  0-50 mL Intravenous PRN Toy Baker, MD       DULoxetine (CYMBALTA) DR capsule 60 mg  60 mg Oral Daily Toy Baker, MD   60 mg at 09/20/21 0850   gabapentin (NEURONTIN) capsule 300 mg  300 mg Oral TID  Toy Baker, MD   300 mg at 09/20/21 0851   guaiFENesin (ROBITUSSIN) 100 MG/5ML liquid 5 mL  5 mL Oral Q6H PRN Opyd, Ilene Qua, MD   5 mL at 09/20/21 0851   insulin aspart (novoLOG) injection 0-5 Units  0-5 Units Subcutaneous QHS Hall, Carole N, DO       insulin aspart (novoLOG) injection 0-9 Units  0-9 Units Subcutaneous TID WC Irene Pap N, DO   2 Units at 09/20/21 7106   insulin glargine-yfgn [facility]) injection 5 Units  5 Units Subcutaneous Daily Kayleen Memos, DO   5 Units at 09/19/21 0950   loperamide (IMODIUM) capsule 2 mg  2 mg Oral PRN Kayleen Memos, DO       metoprolol tartrate (LOPRESSOR) injection 5 mg  5 mg Intravenous Q2H PRN Opyd, Ilene Qua, MD   5 mg at 09/19/21 0449   metoprolol tartrate (LOPRESSOR) tablet 12.5 mg  12.5 mg Oral BID Irene Pap N, DO   12.5 mg at 09/20/21 0850   pantoprazole (PROTONIX) injection 40 mg  40 mg Intravenous Q12H Hall, Carole N, DO       potassium chloride SA (KLOR-CON M) CR tablet 40 mEq  40 mEq Oral BID Hall, Carole N, DO   40 mEq at 09/20/21 0850    Allergies as of 09/16/2021   (No Known Allergies)    Family History  Problem Relation Age of Onset   Diabetes Mother    Parkinson's disease Mother    Lung cancer Father    Heart attack Father    Breast cancer Sister    CAD Brother    Diabetes Maternal Grandmother    Lung cancer Paternal Grandfather    Colon cancer Neg Hx    Stomach cancer Neg Hx    Colon polyps Neg Hx    Esophageal cancer Neg Hx    Rectal cancer Neg Hx     Social History   Socioeconomic History   Marital status: Married    Spouse name: Staton Markey   Number of children: Not on file   Years of education: Not on file   Highest education level: Not on file  Occupational History   Not on file  Tobacco Use   Smoking status: Former   Smokeless tobacco: Never  Vaping Use   Vaping Use: Never used  Substance and Sexual Activity   Alcohol use: Yes    Comment: occasional wine   Drug use: No   Sexual  activity: Not on file  Other Topics Concern   Not on file  Social History Narrative   Not on file   Social Determinants of Health   Financial Resource Strain: Not on file  Food Insecurity: Not on file  Transportation Needs: Not on  file  Physical Activity: Not on file  Stress: Not on file  Social Connections: Not on file  Intimate Partner Violence: Not on file    Review of Systems: Gen: Denies any fever, chills, sweats, anorexia, fatigue, weakness, malaise, weight loss, and sleep disorder CV: Denies chest pain, angina, palpitations, syncope, orthopnea, PND, peripheral edema, and claudication. Resp: Denies dyspnea at rest, dyspnea with exercise, cough, sputum, wheezing, coughing up blood, and pleurisy. GI: Denies vomiting blood, jaundice, and fecal incontinence.   Denies dysphagia or odynophagia. GU : Denies urinary burning, blood in urine, urinary frequency, urinary hesitancy, nocturnal urination, and urinary incontinence. MS: Denies joint pain, limitation of movement, and swelling, stiffness, low back pain, extremity pain. Denies muscle weakness, cramps, atrophy.  Derm: Denies rash, itching, dry skin, hives, moles, warts, or unhealing ulcers.  Psych: Denies depression, anxiety, memory loss, suicidal ideation, hallucinations, paranoia, and confusion. Heme: Denies bruising, bleeding, and enlarged lymph nodes. Neuro:  Denies any headaches, dizziness, paresthesias. Endo:  Denies any problems with DM, thyroid, adrenal function.  Physical Exam: Vital signs in last 24 hours: Temp:  [97.7 F (36.5 C)-99.1 F (37.3 C)] 97.7 F (36.5 C) (02/11 0751) Pulse Rate:  [72-90] 76 (02/11 0751) Resp:  [19-28] 24 (02/11 0751) BP: (111-153)/(56-105) 153/70 (02/11 0751) SpO2:  [90 %-94 %] 93 % (02/11 0751) Last BM Date: 09/19/21  General:  Alert, well-developed, well-nourished, ill appearing in NAD Head:  Normocephalic and atraumatic. Eyes:  Sclera clear, no icterus. Conjunctiva pink. Ears:   Normal auditory acuity. Nose:  No deformity, discharge, or lesions. Mouth:  No deformity or lesions. Oropharynx pink & moist. Neck:  Supple; no masses or thyromegaly. Chest:  Clear throughout to auscultation. No wheezes, crackles, or rhonchi. No acute distress. Heart:  Regular rate and rhythm; no murmurs, clicks, rubs, or gallops. Abdomen:  Soft, nontender and nondistended. No masses, hepatosplenomegaly or hernias noted. Normal bowel sounds, without guarding, and without rebound.   Rectal:  Deferred.   Msk:  Symmetrical without gross deformities. Normal posture. Pulses:  Normal pulses noted. Extremities:  Without clubbing or edema. Neurologic:  Alert and  oriented x4;  grossly normal neurologically. Skin:  Intact without significant lesions or rashes. Cervical Nodes:  No significant cervical adenopathy. Psych:  Alert and cooperative. Normal mood and affect.  Intake/Output from previous day: 02/10 0701 - 02/11 0700 In: 480 [P.O.:480] Out: 1100 [Urine:1100] Intake/Output this shift: No intake/output data recorded.  Lab Results: Recent Labs    09/18/21 0910 09/19/21 0543 09/20/21 0626  WBC 14.6* 11.7* 11.0*  HGB 13.0 14.0 12.7*  HCT 38.5* 39.6 36.5*  PLT 253 242 249   BMET Recent Labs    09/18/21 1836 09/19/21 0140 09/20/21 0626  NA 136 135 137  K 3.7 3.9 3.4*  CL 105 103 103  CO2 19* 20* 19*  GLUCOSE 136* 138* 122*  BUN 16 15 14   CREATININE 0.72 0.74 0.73  CALCIUM 9.3 9.2 8.6*    Previous Endoscopies: See HPI  Impression/ Recommendations: GERD with LA Grade A esophagitis and gastroparesis in setting of DKA explains epigastric pain, thickened esophagus on CT and gastric distention on CT.   Continue pantoprazole 40 mg twice daily change to PO in next day or two.  Tums as needed. No plans for endoscopic evaluation at this time.  Worsening diarrhea in setting of DKA and CAP which has improved.  Rule out infectious etiologies, diabetic diarrhea, antibiotic side  effects.  Chronic intermittent diarrhea and moderately increased stool volume right colon on  CT suggesting intermittent constipation. Check stool studies.  DKA type 2 with metabolic acidosis CAP on Rocephin Diabetic neuropathy New afib with RVR, now on Eliquis Hypokalemia Mild normocytic anemia    LOS: 3 days   Adrie Picking T. Fuller Plan MD 09/20/2021, 9:56 AM See Shea Evans, [facility], to contact our on call provider

## 2021-09-20 NOTE — Plan of Care (Signed)

## 2021-09-20 NOTE — Evaluation (Signed)
Occupational Therapy Evaluation Patient Details Name: Willie [name] MRN: [id number] DOB: Mar 24, 1956 Today's Date: 09/20/2021   History of Present Illness Pt is 66 yo male admitted on 09/16/21 for CAP, DKA,  and new afib with RVR.  Pt with hx including DM, HLD, HTN.   Clinical Impression   Willie [name] was evaluated s/p the above admission list. He is indep at baseline including working and driving. He lives in a 1 level home with his wife who can assist as needed at d/c. Upon evaluation pt was supervision for all sitting ADLs and min guard for OOB ADLs and functional mobility without AD. He is limited by general fatigue and malaise, poor activity tolerance and unsteadiness. He will benefit from OT acutely to progress toward his baseline prior to d/c home. Anticipate pt progress well acutely without need for follow up OT.    Recommendations for follow up therapy are one component of a multi-disciplinary discharge planning process, led by the attending physician.  Recommendations may be updated based on patient status, additional functional criteria and insurance authorization.   Follow Up Recommendations  No OT follow up    Assistance Recommended at Discharge Intermittent Supervision/Assistance  Patient can return home with the following A little help with walking and/or transfers;A little help with bathing/dressing/bathroom;Help with stairs or ramp for entrance    Functional Status Assessment  Patient has had a recent decline in their functional status and demonstrates the ability to make significant improvements in function in a reasonable and predictable amount of time.  Equipment Recommendations  None recommended by OT    Recommendations for Other Services       Precautions / Restrictions Precautions Precautions: Fall Restrictions Weight Bearing Restrictions: No      Mobility Bed Mobility Overal bed mobility: Needs Assistance Bed Mobility: Supine to Sit, Sit to Supine     Supine to  sit: Supervision Sit to supine: Supervision        Transfers Overall transfer level: Needs assistance Equipment used: None Transfers: Sit to/from Stand Sit to Stand: Min guard           General transfer comment: mildly unsteady upon standing      Balance Overall balance assessment: Needs assistance Sitting-balance support: Feet supported Sitting balance-Leahy Scale: Normal     Standing balance support: No upper extremity supported, During functional activity Standing balance-Leahy Scale: Fair                             ADL either performed or assessed with clinical judgement   ADL Overall ADL's : Needs assistance/impaired Eating/Feeding: Set up;Sitting   Grooming: Oral care;Min guard;Standing Grooming Details (indicate cue type and reason): at the sink Upper Body Bathing: Set up;Sitting   Lower Body Bathing: Min guard;Sit to/from stand   Upper Body Dressing : Set up;Sitting   Lower Body Dressing: Min guard;Sit to/from stand   Toilet Transfer: Min guard;Ambulation;Regular Museum/gallery exhibitions officer and Hygiene: Min guard;Sit to/from stand       Functional mobility during ADLs: Min guard General ADL Comments: limited by poor activity tolerance, endurance and fatigue. pt generally feeling unwell, needs min guard for safety OOB due to some unsteady gait     Vision Baseline Vision/History: 0 No visual deficits Ability to See in Adequate Light: 0 Adequate Patient Visual Report: No change from baseline Vision Assessment?: No apparent visual deficits     Perception     Praxis  Pertinent Vitals/Pain Pain Assessment Pain Assessment: Faces Faces Pain Scale: Hurts a little bit Pain Location: generalized/sick Pain Descriptors / Indicators: Discomfort Pain Intervention(s): Monitored during session     Hand Dominance Right   Extremity/Trunk Assessment Upper Extremity Assessment Upper Extremity Assessment: Overall WFL for  tasks assessed;Generalized weakness (pt complained of "dropping" things more)   Lower Extremity Assessment Lower Extremity Assessment: Defer to PT evaluation   Cervical / Trunk Assessment Cervical / Trunk Assessment: Other exceptions Cervical / Trunk Exceptions: forward head (mild)   Communication Communication Communication: No difficulties   Cognition Arousal/Alertness: Awake/alert Behavior During Therapy: WFL for tasks assessed/performed Overall Cognitive Status: Within Functional Limits for tasks assessed                                 General Comments: HOH but otherwise WFL, good insight     General Comments  VSS on RA, wife present    Exercises     Shoulder Instructions      Home Living Family/patient expects to be discharged to:: Private residence Living Arrangements: Spouse/significant other Available Help at Discharge: Available 24 hours/day;Family Type of Home: House Home Access: Stairs to enter [employer] of Steps: 2 Entrance Stairs-Rails: None Home Layout: Multi-level;Able to live on main level with bedroom/bathroom     Bathroom Shower/Tub: Occupational psychologist: Standard Bathroom Accessibility: Yes   Home Equipment: Cane - single point;Rolling Walker (2 wheels);Rollator (4 wheels)          Prior Functioning/Environment Prior Level of Function : Independent/Modified Independent;Working/employed;Driving             Mobility Comments: Independent with mobility ADLs Comments: Independent with ADLs, IADLs, and works at [employer] Problem List: Decreased strength;Decreased range of motion;Decreased activity tolerance;Impaired balance (sitting and/or standing)      OT Treatment/Interventions: Self-care/ADL training;Therapeutic exercise;DME and/or AE instruction;Therapeutic activities;Patient/family education;Balance training    OT Goals(Current goals can be found in the care plan section) Acute  Rehab OT Goals Patient Stated Goal: to feel better, back to work OT Goal Formulation: With patient Time For Goal Achievement: 10/04/21 Potential to Achieve Goals: Good ADL Goals Pt Will Perform Lower Body Dressing: Independently;sit to/from stand Pt Will Transfer to Toilet: Independently;ambulating;regular height toilet Pt/caregiver will Perform Home Exercise Program: Increased strength;Both right and left upper extremity;With written HEP provided Additional ADL Goal #1: Pt will indep verbalize at least 3 fall prevention strategies to apply to the home setting  OT Frequency: Min 2X/week    Co-evaluation              AM-PAC OT "6 Clicks" Daily Activity     Outcome Measure Help from another person eating meals?: None Help from another person taking care of personal grooming?: A Little Help from another person toileting, which includes using toliet, bedpan, or urinal?: A Little Help from another person bathing (including washing, rinsing, drying)?: A Little Help from another person to put on and taking off regular upper body clothing?: None Help from another person to put on and taking off regular lower body clothing?: A Little 6 Click Score: 20   End of Session Equipment Utilized During Treatment: Gait belt Nurse Communication: Mobility status  Activity Tolerance: Patient tolerated treatment well Patient left: in bed;with call bell/phone within reach;with family/visitor present  OT Visit Diagnosis: Unsteadiness on feet (R26.81);Other abnormalities of gait and mobility (R26.89);Muscle weakness (  generalized) (M62.81);History of falling (Z91.81)                Time: 0034-9179 OT Time Calculation (min): 16 min Charges:  OT General Charges $OT Visit: 1 Visit OT Evaluation $OT Eval Moderate Complexity: 1 Mod   Salle Brandle A Taylorann Tkach 09/20/2021, 12:19 PM

## 2021-09-20 NOTE — Progress Notes (Signed)
PROGRESS NOTE  Amedio Bowlby KVQ:[id number] DOB: June 17, 1956 DOA: 09/16/2021 PCP: Ginger Organ., MD  HPI/Recap of past 30 hours: 66 year old male history of type 2 diabetes, diabetic polyneuropathy, hypertension, hyperlipidemia who presented to the hospital with complaints of generalized weakness.  Associated with epigastric burning pain.  Work-up revealed DKA type II completed DKA type II protocol and was transitioned to subcu insulin.    Hospital course complicated by diarrhea and persistent epigastric pain.  GI consulted, recommended to continue IV Protonix 40 mg twice daily for 1 to 2 days then switch to oral Protonix.  No plan for endoscopy during this admission.  09/20/21: Patient was seen and examined at his bedside this morning.  Reports epigastric pain worse with palpation and with swallowing solids and liquids.    Assessment/Plan: Active Problems:   Diabetic neuropathy with neurologic complication (HCC)   Hypertension   Hyperlipemia   CAP (community acquired pneumonia)   Severe sepsis ([place])   DKA, type 2, not at goal [facility])   UTI (urinary tract infection)   Ileus ([place])   Rapid atrial fibrillation ([place])  CAP (community acquired pneumonia), viral with concern for superimposed bacterial infection Respiratory panel by PCR positive for rhinovirus/enterovirus Procalcitonin downtrending 0.31 from 2.79 on 09/17/2021. Completed 4 days of Rocephin on 09/20/2021. Antitussives, Tessalon Perles Bronchodilators, DuoNeb nebs every 6 hours  DKA type 2 with hyperglycemia Continue Lantus and ISS A1C 7.7 on 09/17/21  Mild anion gap metabolic acidosis Serum bicarb 19 from 20, anion gap 15 from 12 Continue to monitor for now Continue to hold off home Jardiance which can cause ketoacidosis.  New Afib with RVR, converted back to sinus rhythm CHADSVASC 4 Rate is now controlled after brief cardizem infusion Started on eliquis 2/9 for secondary CVA prevention 2D echo done on 09/20/2021 with  normal LVEF Will follow-up with cardiology outpatient, Dr. Nadyne Coombes.  Patient advised to keep his appointment.  Diarrhea, unclear etiology. Afebrile, WBC is downtrending. Follow gastrointestinal panel by PCR Seen by GI Continue to replete electrolytes  Epigastric pain in the setting of history of LA grade a esophagitis Continue management as recommended by GI IV Protonix 40 mg twice daily and switch to oral Protonix in 1 to 2 days  Hypokalemia secondary to GI losses from diarrhea Serum potassium 3.4 from 2.5  Repleted orally Repeat BMP in the morning.  Resolving high anion metabolic acidosis Resolving Continue IV fluid hydration   Hypertension BP stable Monitor vital signs   Hyperlipemia Continue home meds   Severe sepsis (HCC)  -SIRS criteria met with  elevated white blood cell count,     Hyperbilirubinemia Tbili 1.6 Repeat CMP AM  Physical debility PT OT assessment, no PT follow-up Continue fall precautions     Code Status: Full   Family Communication: Wife at bedside   Disposition Plan: Likely dc to home tomorrow 09/21/21 or when GI signs off.   Consultants: GI, [place]  Procedures: none   Antimicrobials: Rocephin azithromycin   DVT prophylaxis:  eliquis  Status is: Inpatient        Objective: Vitals:   09/19/21 2343 09/20/21 0315 09/20/21 0751 09/20/21 1150  BP: 111/68 (!) 121/56 (!) 153/70 136/72  Pulse: 72 77 76 69  Resp: (!) 22 (!) 23 (!) 24 (!) 22  Temp: 98.5 F (36.9 C) 98.4 F (36.9 C) 97.7 F (36.5 C) 97.9 F (36.6 C)  TempSrc: Oral Oral Oral Oral  SpO2: 90% 94% 93% 95%  Weight:      Height:  Intake/Output Summary (Last 24 hours) at 09/20/2021 1308 Last data filed at 09/20/2021 0347 Gross per 24 hour  Intake 240 ml  Output 600 ml  Net -360 ml   Filed Weights   09/16/21 2221 09/18/21 1611  Weight: 74.4 kg 72.6 kg    Exam:  General: 66 y.o. year-old male well-developed well-nourished in no acute distress.   He is alert and oriented x3.  Hard of hearing.   Cardiovascular: Regular rate and rhythm no rubs or gallops.   Respiratory: Clear to auscultation no wheezes or rales. Abdomen: Soft epigastric pain with palpation.  Bowel sounds present.  Musculoskeletal: No lower extremity edema bilaterally. Skin: No ulcerative lesions noted. Psychiatry: Mood is appropriate for condition Neuro: Moves all 4 extremities.   Data Reviewed: CBC: Recent Labs  Lab 09/16/21 1547 09/16/21 2237 09/17/21 0105 09/17/21 0357 09/17/21 1903 09/18/21 0910 09/19/21 0543 09/20/21 0626  WBC 22.3*  --  16.9*  --   --  14.6* 11.7* 11.0*  NEUTROABS 20.2*  --  15.4*  --   --  11.2* 8.9*  --   HGB 17.0   < > 14.5 14.6 13.6 13.0 14.0 12.7*  HCT 51.2   < > 44.8 43.0 40.0 38.5* 39.6 36.5*  MCV 92.1  --  92.8  --   --  89.1 87.8 87.3  PLT 397  --  294  --   --  253 242 249   < > = values in this interval not displayed.   Basic Metabolic Panel: Recent Labs  Lab 09/17/21 0105 09/17/21 0339 09/17/21 2308 09/17/21 2321 09/18/21 1138 09/18/21 1506 09/18/21 1836 09/19/21 0140 09/19/21 0543 09/20/21 0626  NA 132*   < >  --    < > 138 134* 136 135  --  137  K 4.7   < >  --    < > 3.2* 3.7 3.7 3.9  --  3.4*  CL 102   < >  --    < > 108 106 105 103  --  103  CO2 7*   < >  --    < > 18* 20* 19* 20*  --  19*  GLUCOSE 280*   < >  --    < > 143* 195* 136* 138*  --  122*  BUN 45*   < >  --    < > $R'17 15 16 15  'Uk$ --  14  CREATININE 1.58*   < >  --    < > 0.80 0.75 0.72 0.74  --  0.73  CALCIUM 9.0   < >  --    < > 9.4 9.1 9.3 9.2  --  8.6*  MG 2.8*  --  2.5*  --   --   --   --   --  2.1  --   PHOS 3.4  --   --   --   --   --   --   --  3.0  --    < > = values in this interval not displayed.   GFR: Estimated Creatinine Clearance: 92 mL/min (by C-G formula based on SCr of 0.73 mg/dL). Liver Function Tests: Recent Labs  Lab 09/16/21 1547 09/17/21 0105  AST 12* 14*  ALT 16 13  ALKPHOS 119 95  BILITOT 1.1 1.6*  PROT 8.7*  6.7  ALBUMIN 4.1 2.9*   Recent Labs  Lab 09/16/21 2229  LIPASE 56*   No results for input(s): AMMONIA in the last  168 hours. Coagulation Profile: Recent Labs  Lab 09/16/21 1547  INR 1.2   Cardiac Enzymes: Recent Labs  Lab 09/17/21 0105  CKTOTAL 59   BNP (last 3 results) No results for input(s): PROBNP in the last 8760 hours. HbA1C: No results for input(s): HGBA1C in the last 72 hours.  CBG: Recent Labs  Lab 09/19/21 1149 09/19/21 1547 09/19/21 2009 09/20/21 0753 09/20/21 1157  GLUCAP 165* 133* 151* 157* 113*   Lipid Profile: No results for input(s): CHOL, HDL, LDLCALC, TRIG, CHOLHDL, LDLDIRECT in the last 72 hours. Thyroid Function Tests: No results for input(s): TSH, T4TOTAL, FREET4, T3FREE, THYROIDAB in the last 72 hours.  Anemia Panel: Recent Labs    09/20/21 0626  FERRITIN 577*  TIBC 202*  IRON 77   Urine analysis:    Component Value Date/Time   COLORURINE YELLOW 09/16/2021 2015   APPEARANCEUR HAZY (A) 09/16/2021 2015   LABSPEC 1.022 09/16/2021 2015   PHURINE 5.0 09/16/2021 2015   GLUCOSEU >=500 (A) 09/16/2021 2015   HGBUR MODERATE (A) 09/16/2021 2015   BILIRUBINUR NEGATIVE 09/16/2021 2015   KETONESUR 80 (A) 09/16/2021 2015   PROTEINUR 100 (A) 09/16/2021 2015   NITRITE NEGATIVE 09/16/2021 2015   LEUKOCYTESUR NEGATIVE 09/16/2021 2015   Sepsis Labs: $RemoveBefo'@[email or web address]'NzcZEgNWmvt$ (procalcitonin:4,lacticidven:4)  ) Recent Results (from the past 240 hour(s))  Urine Culture     Status: None   Collection Time: 09/16/21  3:29 PM   Specimen: In/Out Cath Urine  Result Value Ref Range Status   Specimen Description IN/OUT CATH URINE  Final   Special Requests NONE  Final   Culture   Final    NO GROWTH Performed at [facility], [place] [address], [place], [place] [address]    Report Status 09/18/2021 FINAL  Final  Resp Panel by RT-PCR (Flu A&B, Covid) Peripheral     Status: None   Collection Time: 09/16/21  3:30 PM   Specimen: Peripheral; Nasopharyngeal(NP)  swabs in vial transport medium  Result Value Ref Range Status   SARS Coronavirus 2 by RT PCR NEGATIVE NEGATIVE Final    Comment: (NOTE) SARS-CoV-2 target nucleic acids are NOT DETECTED.  The SARS-CoV-2 RNA is generally detectable in upper respiratory specimens during the acute phase of infection. The lowest concentration of SARS-CoV-2 viral copies this assay can detect is 138 copies/mL. A negative result does not preclude SARS-Cov-2 infection and should not be used as the sole basis for treatment or other patient management decisions. A negative result may occur with  improper specimen collection/handling, submission of specimen other than nasopharyngeal swab, presence of viral mutation(s) within the areas targeted by this assay, and inadequate number of viral copies(<138 copies/mL). A negative result must be combined with clinical observations, patient history, and epidemiological information. The expected result is Negative.  Fact Sheet for Patients:  EntrepreneurPulse.com.au  Fact Sheet for Healthcare Providers:  IncredibleEmployment.be  This test is no t yet approved or cleared by the [place] FDA and  has been authorized for detection and/or diagnosis of SARS-CoV-2 by FDA under an Emergency Use Authorization (EUA). This EUA will remain  in effect (meaning this test can be used) for the duration of the COVID-19 declaration under Section 564(b)(1) of the Act, 21 U.S.C.section 360bbb-3(b)(1), unless the authorization is terminated  or revoked sooner.       Influenza A by PCR NEGATIVE NEGATIVE Final   Influenza B by PCR NEGATIVE NEGATIVE Final    Comment: (NOTE) The Xpert Xpress SARS-CoV-2/FLU/RSV plus assay is intended as an  aid in the diagnosis of influenza from Nasopharyngeal swab specimens and should not be used as a sole basis for treatment. Nasal washings and aspirates are unacceptable for Xpert Xpress  SARS-CoV-2/FLU/RSV testing.  Fact Sheet for Patients: BloggerCourse.com  Fact Sheet for Healthcare Providers: SeriousBroker.it  This test is not yet approved or cleared by the [place] FDA and has been authorized for detection and/or diagnosis of SARS-CoV-2 by FDA under an Emergency Use Authorization (EUA). This EUA will remain in effect (meaning this test can be used) for the duration of the COVID-19 declaration under Section 564(b)(1) of the Act, 21 U.S.C. section 360bbb-3(b)(1), unless the authorization is terminated or revoked.  Performed at [facility], [address], [place], [place] [address]   Respiratory (~20 pathogens) panel by PCR     Status: Abnormal   Collection Time: 09/16/21  3:30 PM   Specimen: Nasopharyngeal Swab; Respiratory  Result Value Ref Range Status   Adenovirus NOT DETECTED NOT DETECTED Final   Coronavirus 229E NOT DETECTED NOT DETECTED Final    Comment: (NOTE) The Coronavirus on the Respiratory Panel, DOES NOT test for the novel  Coronavirus (2019 nCoV)    Coronavirus HKU1 NOT DETECTED NOT DETECTED Final   Coronavirus NL63 NOT DETECTED NOT DETECTED Final   Coronavirus OC43 NOT DETECTED NOT DETECTED Final   Metapneumovirus NOT DETECTED NOT DETECTED Final   Rhinovirus / Enterovirus DETECTED (A) NOT DETECTED Final   Influenza A NOT DETECTED NOT DETECTED Final   Influenza B NOT DETECTED NOT DETECTED Final   Parainfluenza Virus 1 NOT DETECTED NOT DETECTED Final   Parainfluenza Virus 2 NOT DETECTED NOT DETECTED Final   Parainfluenza Virus 3 NOT DETECTED NOT DETECTED Final   Parainfluenza Virus 4 NOT DETECTED NOT DETECTED Final   Respiratory Syncytial Virus NOT DETECTED NOT DETECTED Final   Bordetella pertussis NOT DETECTED NOT DETECTED Final   Bordetella Parapertussis NOT DETECTED NOT DETECTED Final   Chlamydophila pneumoniae NOT DETECTED NOT DETECTED Final   Mycoplasma pneumoniae NOT  DETECTED NOT DETECTED Final    Comment: Performed at [facility], [address] [place], [place] [address]  Blood Culture (routine x 2)     Status: None (Preliminary result)   Collection Time: 09/16/21  3:47 PM   Specimen: BLOOD  Result Value Ref Range Status   Specimen Description BLOOD SITE NOT SPECIFIED  Final   Special Requests   Final    BOTTLES DRAWN AEROBIC AND ANAEROBIC Blood Culture adequate volume   Culture   Final    NO GROWTH 4 DAYS Performed at [facility], [address] [place], [place] [address]    Report Status PENDING  Incomplete  Blood Culture (routine x 2)     Status: None (Preliminary result)   Collection Time: 09/16/21  7:58 PM   Specimen: BLOOD RIGHT ARM  Result Value Ref Range Status   Specimen Description BLOOD RIGHT ARM  Final   Special Requests   Final    BOTTLES DRAWN AEROBIC AND ANAEROBIC Blood Culture adequate volume   Culture   Final    NO GROWTH 4 DAYS Performed at [facility], [address], [place], [place] [address]    Report Status PENDING  Incomplete      Studies: ECHOCARDIOGRAM COMPLETE  Result Date: 09/20/2021    ECHOCARDIOGRAM REPORT   Patient Name:   Willie [name] Date of Exam: 09/20/2021 Medical Rec #:  [id number]     Height:  71.0 in Accession #:    [id number]    Weight:       160.0 lb Date of Birth:  [birth year]      BSA:          1.918 m Patient Age:    65 years      BP:           153/70 mmHg Patient Gender: M             HR:           66 bpm. Exam Location:  Inpatient Procedure: 2D Echo, Color Doppler and Cardiac Doppler Indications:    I48.91* Unspecified atrial fibrillation  History:        Patient has no prior history of Echocardiogram examinations.                 Arrythmias:Atrial Fibrillation; Risk Factors:Hypertension,                 Diabetes and Dyslipidemia.  Sonographer:    Irving Burton Senior RDCS Referring Phys: [id number] Friend Dorfman N Viraaj Vorndran IMPRESSIONS  1. Left ventricular ejection fraction, by estimation, is 55 to  60%. The left ventricle has normal function. The left ventricle has no regional wall motion abnormalities. Left ventricular diastolic parameters are indeterminate.  2. Right ventricular systolic function is normal. The right ventricular size is normal. Tricuspid regurgitation signal is inadequate for assessing PA pressure.  3. The mitral valve is normal in structure. Trivial mitral valve regurgitation. No evidence of mitral stenosis.  4. The aortic valve is tricuspid. Aortic valve regurgitation is not visualized. Aortic valve sclerosis/calcification is present, without any evidence of aortic stenosis.  5. The inferior vena cava is normal in size with greater than 50% respiratory variability, suggesting right atrial pressure of 3 mmHg. FINDINGS  Left Ventricle: Left ventricular ejection fraction, by estimation, is 55 to 60%. The left ventricle has normal function. The left ventricle has no regional wall motion abnormalities. The left ventricular internal cavity size was normal in size. There is  no left ventricular hypertrophy. Left ventricular diastolic parameters are indeterminate. Normal left ventricular filling pressure. Right Ventricle: The right ventricular size is normal. No increase in right ventricular wall thickness. Right ventricular systolic function is normal. Tricuspid regurgitation signal is inadequate for assessing PA pressure. Left Atrium: Left atrial size was normal in size. Right Atrium: Right atrial size was normal in size. Pericardium: There is no evidence of pericardial effusion. Mitral Valve: The mitral valve is normal in structure. Trivial mitral valve regurgitation. No evidence of mitral valve stenosis. Tricuspid Valve: The tricuspid valve is normal in structure. Tricuspid valve regurgitation is mild . No evidence of tricuspid stenosis. Aortic Valve: The aortic valve is tricuspid. Aortic valve regurgitation is not visualized. Aortic valve sclerosis/calcification is present, without any  evidence of aortic stenosis. Pulmonic Valve: The pulmonic valve was normal in structure. Pulmonic valve regurgitation is trivial. No evidence of pulmonic stenosis. Aorta: The aortic root is normal in size and structure. Venous: The inferior vena cava is normal in size with greater than 50% respiratory variability, suggesting right atrial pressure of 3 mmHg. IAS/Shunts: No atrial level shunt detected by color flow Doppler.  LEFT VENTRICLE PLAX 2D LVIDd:         4.80 cm   Diastology LVIDs:         3.00 cm   LV e' medial:    7.18 cm/s LV PW:         1.20 cm  LV E/e' medial:  11.5 LV IVS:        0.70 cm   LV e' lateral:   10.60 cm/s LVOT diam:     2.00 cm   LV E/e' lateral: 7.8 LV SV:         79 LV SV Index:   41 LVOT Area:     3.14 cm  RIGHT VENTRICLE RV S prime:     13.80 cm/s TAPSE (M-mode): 2.5 cm LEFT ATRIUM             Index        RIGHT ATRIUM           Index LA diam:        3.70 cm 1.93 cm/m   RA Area:     15.60 cm LA Vol (A2C):   61.8 ml 32.22 ml/m  RA Volume:   38.80 ml  20.23 ml/m LA Vol (A4C):   42.4 ml 22.11 ml/m LA Biplane Vol: 52.2 ml 27.22 ml/m  AORTIC VALVE LVOT Vmax:   117.00 cm/s LVOT Vmean:  78.400 cm/s LVOT VTI:    0.253 m  AORTA Ao Root diam: 3.20 cm Ao Asc diam:  3.30 cm MITRAL VALVE MV Area (PHT): 3.15 cm    SHUNTS MV Decel Time: 241 msec    Systemic VTI:  0.25 m MV E velocity: 82.30 cm/s  Systemic Diam: 2.00 cm MV A velocity: 80.20 cm/s MV E/A ratio:  1.03 Fransico Him MD Electronically signed by Fransico Him MD Signature Date/Time: 09/20/2021/12:58:12 PM    Final     Scheduled Meds:  apixaban  5 mg Oral BID   aspirin EC  81 mg Oral Daily   atorvastatin  40 mg Oral Daily   benzonatate  200 mg Oral TID   DULoxetine  60 mg Oral Daily   gabapentin  300 mg Oral TID   insulin aspart  0-5 Units Subcutaneous QHS   insulin aspart  0-9 Units Subcutaneous TID WC   insulin glargine-yfgn  5 Units Subcutaneous Daily   metoprolol tartrate  12.5 mg Oral BID   pantoprazole (PROTONIX) IV  40  mg Intravenous Q12H   potassium chloride  40 mEq Oral BID    Continuous Infusions:  cefTRIAXone (ROCEPHIN)  IV 2 g (09/19/21 1604)     LOS: 3 days     Kayleen Memos, MD [facility] Pager [phone]  If 7PM-7AM, please contact night-coverage [email or web address] Password [facility] 09/20/2021, 1:08 PM

## 2021-09-21 LAB — CULTURE, BLOOD (ROUTINE X 2)
Culture: NO GROWTH
Culture: NO GROWTH
Special Requests: ADEQUATE
Special Requests: ADEQUATE

## 2021-09-21 LAB — BASIC METABOLIC PANEL
Anion gap: 12 (ref 5–15)
BUN: 10 mg/dL (ref 8–23)
CO2: 21 mmol/L — ABNORMAL LOW (ref 22–32)
Calcium: 8.5 mg/dL — ABNORMAL LOW (ref 8.9–10.3)
Chloride: 101 mmol/L (ref 98–111)
Creatinine, Ser: 0.66 mg/dL (ref 0.61–1.24)
GFR, Estimated: >60 mL/min (ref >60)
Glucose, Bld: 132 mg/dL — ABNORMAL HIGH (ref 70–99)
Potassium: 3.8 mmol/L (ref 3.5–5.1)
Sodium: 134 mmol/L — ABNORMAL LOW (ref 135–145)

## 2021-09-21 LAB — CBC
HCT: 37 % — ABNORMAL LOW (ref 39.0–52.0)
Hemoglobin: 12.9 g/dL — ABNORMAL LOW (ref 13.0–17.0)
MCH: 30.5 pg (ref 26.0–34.0)
MCHC: 34.9 g/dL (ref 30.0–36.0)
MCV: 87.5 fL (ref 80.0–100.0)
Platelets: 259 10*3/uL (ref 150–400)
RBC: 4.23 MIL/uL (ref 4.22–5.81)
RDW: 12.8 % (ref 11.5–15.5)
WBC: 12 10*3/uL — ABNORMAL HIGH (ref 4.0–10.5)
nRBC: 0 % (ref 0.0–0.2)

## 2021-09-21 LAB — MAGNESIUM: Magnesium: 1.9 mg/dL (ref 1.7–2.4)

## 2021-09-21 LAB — GLUCOSE, CAPILLARY: Glucose-Capillary: 126 mg/dL — ABNORMAL HIGH (ref 70–99)

## 2021-09-21 MED ORDER — PANTOPRAZOLE SODIUM 40 MG PO TBEC: 40.0000 mg | DELAYED_RELEASE_TABLET | Freq: Two times a day (BID) | ORAL | Status: DC

## 2021-09-21 MED ORDER — POTASSIUM CHLORIDE CRYS ER 20 MEQ PO TBCR
40.0000 meq | EXTENDED_RELEASE_TABLET | ORAL | Status: AC
  Administered 2021-09-21: 40 meq via ORAL
  Filled 2021-09-21: qty 2

## 2021-09-21 MED ORDER — APIXABAN 5 MG PO TABS: 5.0000 mg | ORAL_TABLET | Freq: Two times a day (BID) | ORAL | 0 refills | Status: DC

## 2021-09-21 MED ORDER — PANTOPRAZOLE SODIUM 40 MG PO TBEC: 40.0000 mg | DELAYED_RELEASE_TABLET | Freq: Two times a day (BID) | ORAL | 0 refills | Status: DC

## 2021-09-21 MED ORDER — BENZONATATE 200 MG PO CAPS: 200.0000 mg | ORAL_CAPSULE | Freq: Three times a day (TID) | ORAL | 0 refills | Status: DC | PRN

## 2021-09-21 MED ORDER — POTASSIUM CHLORIDE CRYS ER 20 MEQ PO TBCR: 40.0000 meq | EXTENDED_RELEASE_TABLET | Freq: Two times a day (BID) | ORAL | Status: DC

## 2021-09-21 MED ORDER — TRESIBA FLEXTOUCH 100 UNIT/ML ~~LOC~~ SOPN: 10.0000 [IU] | PEN_INJECTOR | Freq: Every day | SUBCUTANEOUS | 0 refills | Status: AC

## 2021-09-21 MED ORDER — METOPROLOL TARTRATE 25 MG PO TABS: 12.5000 mg | ORAL_TABLET | Freq: Two times a day (BID) | ORAL | 0 refills | Status: DC

## 2021-09-21 NOTE — Plan of Care (Signed)

## 2021-09-21 NOTE — Progress Notes (Signed)
Progress Note   Subjective  No bowel movements, mild epigastric pain persists. Tolerating heart healthy diet   Objective  Vital signs in last 24 hours: Temp:  [97.9 F (36.6 C)-98 F (36.7 C)] 98 F (36.7 C) (02/12 0320) Pulse Rate:  [68-74] 69 (02/12 0320) Resp:  [13-25] 21 (02/12 0320) BP: (115-138)/(56-93) 119/56 (02/12 0320) SpO2:  [91 %-97 %] 91 % (02/12 0320) Last BM Date: 09/19/21  General: Alert, well-developed, in NAD Heart:  Regular rate and rhythm; no murmurs Chest: Clear to ascultation bilaterally Abdomen:  Soft, nontender and nondistended. Normal bowel sounds, without guarding, and without rebound.   Extremities:  Without edema. Neurologic:  Alert and  oriented x4; grossly normal neurologically. Psych:  Alert and cooperative. Normal mood and affect.  Intake/Output from previous day: 02/11 0701 - 02/12 0700 In: 0  Out: 4000 [Urine:4000] Intake/Output this shift: No intake/output data recorded.  Lab Results: Recent Labs    09/19/21 0543 09/20/21 0626 09/21/21 0551  WBC 11.7* 11.0* 12.0*  HGB 14.0 12.7* 12.9*  HCT 39.6 36.5* 37.0*  PLT 242 249 259   BMET Recent Labs    09/19/21 0140 09/20/21 0626 09/21/21 0551  NA 135 137 134*  K 3.9 3.4* 3.8  CL 103 103 101  CO2 20* 19* 21*  GLUCOSE 138* 122* 132*  BUN 15 14 10   CREATININE 0.74 0.73 0.66  CALCIUM 9.2 8.6* 8.5*     Studies/Results: ECHOCARDIOGRAM COMPLETE  Result Date: 09/20/2021    ECHOCARDIOGRAM REPORT   Patient Name:   Willie [name] Date of Exam: 09/20/2021 Medical Rec #:  [id number]     Height:       71.0 in Accession #:    [id number]    Weight:       160.0 lb Date of Birth:  1956/05/11      BSA:          1.918 m Patient Age:    66 years      BP:           153/70 mmHg Patient Gender: M             HR:           66 bpm. Exam Location:  Inpatient Procedure: 2D Echo, Color Doppler and Cardiac Doppler Indications:    I48.91* Unspecified atrial fibrillation  History:        Patient has no  prior history of Echocardiogram examinations.                 Arrythmias:Atrial Fibrillation; Risk Factors:Hypertension,                 Diabetes and Dyslipidemia.  Sonographer:    Raquel Sarna Senior RDCS Referring Phys: [id number] [place]  1. Left ventricular ejection fraction, by estimation, is 55 to 60%. The left ventricle has normal function. The left ventricle has no regional wall motion abnormalities. Left ventricular diastolic parameters are indeterminate.  2. Right ventricular systolic function is normal. The right ventricular size is normal. Tricuspid regurgitation signal is inadequate for assessing PA pressure.  3. The mitral valve is normal in structure. Trivial mitral valve regurgitation. No evidence of mitral stenosis.  4. The aortic valve is tricuspid. Aortic valve regurgitation is not visualized. Aortic valve sclerosis/calcification is present, without any evidence of aortic stenosis.  5. The inferior vena cava is normal in size with greater than 50% respiratory variability, suggesting right atrial pressure of 3 mmHg. FINDINGS  Left Ventricle:  Left ventricular ejection fraction, by estimation, is 55 to 60%. The left ventricle has normal function. The left ventricle has no regional wall motion abnormalities. The left ventricular internal cavity size was normal in size. There is  no left ventricular hypertrophy. Left ventricular diastolic parameters are indeterminate. Normal left ventricular filling pressure. Right Ventricle: The right ventricular size is normal. No increase in right ventricular wall thickness. Right ventricular systolic function is normal. Tricuspid regurgitation signal is inadequate for assessing PA pressure. Left Atrium: Left atrial size was normal in size. Right Atrium: Right atrial size was normal in size. Pericardium: There is no evidence of pericardial effusion. Mitral Valve: The mitral valve is normal in structure. Trivial mitral valve regurgitation. No evidence of  mitral valve stenosis. Tricuspid Valve: The tricuspid valve is normal in structure. Tricuspid valve regurgitation is mild . No evidence of tricuspid stenosis. Aortic Valve: The aortic valve is tricuspid. Aortic valve regurgitation is not visualized. Aortic valve sclerosis/calcification is present, without any evidence of aortic stenosis. Pulmonic Valve: The pulmonic valve was normal in structure. Pulmonic valve regurgitation is trivial. No evidence of pulmonic stenosis. Aorta: The aortic root is normal in size and structure. Venous: The inferior vena cava is normal in size with greater than 50% respiratory variability, suggesting right atrial pressure of 3 mmHg. IAS/Shunts: No atrial level shunt detected by color flow Doppler.  LEFT VENTRICLE PLAX 2D LVIDd:         4.80 cm   Diastology LVIDs:         3.00 cm   LV e' medial:    7.18 cm/s LV PW:         1.20 cm   LV E/e' medial:  11.5 LV IVS:        0.70 cm   LV e' lateral:   10.60 cm/s LVOT diam:     2.00 cm   LV E/e' lateral: 7.8 LV SV:         79 LV SV Index:   41 LVOT Area:     3.14 cm  RIGHT VENTRICLE RV S prime:     13.80 cm/s TAPSE (M-mode): 2.5 cm LEFT ATRIUM             Index        RIGHT ATRIUM           Index LA diam:        3.70 cm 1.93 cm/m   RA Area:     15.60 cm LA Vol (A2C):   61.8 ml 32.22 ml/m  RA Volume:   38.80 ml  20.23 ml/m LA Vol (A4C):   42.4 ml 22.11 ml/m LA Biplane Vol: 52.2 ml 27.22 ml/m  AORTIC VALVE LVOT Vmax:   117.00 cm/s LVOT Vmean:  78.400 cm/s LVOT VTI:    0.253 m  AORTA Ao Root diam: 3.20 cm Ao Asc diam:  3.30 cm MITRAL VALVE MV Area (PHT): 3.15 cm    SHUNTS MV Decel Time: 241 msec    Systemic VTI:  0.25 m MV E velocity: 82.30 cm/s  Systemic Diam: 2.00 cm MV A velocity: 80.20 cm/s MV E/A ratio:  1.03 Fransico Him MD Electronically signed by Fransico Him MD Signature Date/Time: 09/20/2021/12:58:12 PM    Final       Assessment & Recommendations   GERD with LA Grade A esophagitis. Continue pantoprazole 40 mg PO bid and  continue as outpatient.  Tums as needed. No plans for endoscopic evaluation at this time.  Acute gastroparesis in  setting of DKA, resolved.   Diarrhea, resolved. Check stool studies if diarrhea recurs.  DKA type 2 with metabolic acidosis CAP on Rocephin Diabetic neuropathy New afib with RVR, now on Eliquis Hypokalemia Mild normocytic anemia  Outpatient GI follow up with Dr. Havery Moros in 1 month. GI signing off.     LOS: 4 days   Norberto Sorenson T. Fuller Plan MD 09/21/2021, 9:20 AM See Shea Evans, [facility], to contact our on call provider

## 2021-09-21 NOTE — Discharge Summary (Signed)
Discharge Summary  Willie [name] LMB:[id number] DOB: 05/01/1956  PCP: Ginger Organ., MD  Admit date: 09/16/2021 Discharge date: 09/21/2021  Time spent: 35 minutes.  Recommendations for Outpatient Follow-up:  Follow-up with GI within a month. Follow-up with cardiology in 1 to 2 weeks. Follow-up with your primary care provider in 1 to 2 weeks. Take your medications as prescribed. Continue fall precautions.  Discharge Diagnoses:  Active Hospital Problems   Diagnosis Date Noted   UTI (urinary tract infection) 09/17/2021   Ileus ([place]) 09/17/2021   Rapid atrial fibrillation ([place]) 09/17/2021   CAP (community acquired pneumonia) 09/16/2021   Severe sepsis ([place]) 09/16/2021   DKA, type 2, not at goal [facility]) 09/16/2021   Hypertension 01/19/2020   Hyperlipemia 01/19/2020   Diabetic neuropathy with neurologic complication ([place]) 05/16/1218    Resolved Hospital Problems  No resolved problems to display.    Discharge Condition: Stable.  Diet recommendation: Heart healthy carb modified diet.  Vitals:   09/21/21 0000 09/21/21 0320  BP: 138/69 (!) 119/56  Pulse: 68 69  Resp: (!) 25 (!) 21  Temp: 98 F (36.7 C) 98 F (36.7 C)  SpO2: 93% 91%    History of present illness:  66 year old male history of type 2 diabetes, diabetic polyneuropathy, hypertension, hyperlipidemia who presented to [facility] ED with complaints of generalized weakness.  Associated with epigastric burning pain.  Work-up revealed DKA type II complicated by Streptococcus pneumoniae pneumonia and rhinovirus/Enterococcus viral infection.    Hospital course was complicated by an episode of A-fib with RVR on 09/17/2021 for which he received Cardizem drip and was started on DOAC Eliquis for CHA2DS2-VASc score 4.  He converted back to sinus rhythm, confirmed on twelve-lead EKG done on 09/19/2021 and was started on Lopressor 12.5 mg twice daily.  Discussed with cardiology, Dr. Einar Gip will see outpatient.    Hospital course also  complicated by diarrhea and epigastric pain for which GI was consulted.  Recommended twice daily PPI, p.o. Protonix 40 mg twice daily, until he sees Dr. Havery Moros outpatient in 1 month.     09/21/21: Patient was seen at his bedside.  His wife was present in the room.  Reports his diarrhea has resolved for the past 2 days.  No abdominal pain or nausea.  Tolerating a diet.    Advised to continue to take p.o. Protonix 40 mg twice daily as recommended by GI and to follow-up with Dr. Havery Moros outpatient.  Patient and wife understand and agree with the plan.  Patient also advised to follow-up with cardiology outpatient.  Hospital Course:  Active Problems:   Diabetic neuropathy with neurologic complication (HCC)   Hypertension   Hyperlipemia   CAP (community acquired pneumonia)   Severe sepsis ([place])   DKA, type 2, not at goal [facility])   UTI (urinary tract infection)   Ileus ([place])   Rapid atrial fibrillation (HCC)  Streptococcus pneumoniae CAP (community acquired pneumonia) superimposed by rhinovirus/enterovirus virus infection, POA Respiratory panel by PCR positive for rhinovirus/enterovirus 09/16/2021. Streptococcal pneumoniae urinary antigen + 09/17/2021. Procalcitonin downtrending 0.31 from 2.79 on 09/17/2021. Completed 4 days of Rocephin on 09/20/2021. Continue as needed antitussives, Tessalon Perles   DKA type 2 with hyperglycemia Hemoglobin A1c 7.7 on 09/17/2021. Hold off on Jardiance due to concern for possible side effect of DKA Continue Tresiba and metformin. Follow-up with your PCP in 1 to 2 weeks.   Resolved mild anion gap metabolic acidosis Serum bicarb 21 from 19, anion gap 12 from 15 Continue to hold off home  Jardiance which can cause ketoacidosis. Follow-up with your PCP in 1 to 2 weeks.   New Afib with RVR, converted back to sinus rhythm CHADSVASC 4 Received brief cardizem infusion Started on Lopressor 12.5 mg twice daily for rate control. Started on eliquis 09/18/21 for primary  CVA prevention 2D echo done on 09/20/2021 with normal LVEF Follow-up with cardiology outpatient, Dr. Nadyne Coombes.  Patient advised to keep his appointment.   Resolved acute diarrhea, suspect from viral infection.  Diarrhea has resolved.   Epigastric pain in the setting of history of LA grade A esophagitis Received IV Protonix 40 mg twice daily Switched to oral Protonix 40 mg twice daily x30 days. Follow-up with Dr. Havery Moros in 1 month.   Resolved post repletion: Hypokalemia secondary to GI losses from diarrhea Serum potassium 3.8 from 2.5. Diarrhea has resolved.   Hypertension BP stable Continue Lopressor 12.5 mg twice daily. Follow-up with cardiology and PCP.   Hyperlipemia Continue home Lipitor.   Severe sepsis (HCC)  -SIRS criteria met with elevated white blood cell count, tachypnea, strep pneumoniae pneumonia.   Physical debility PT OT assessment, no PT/OT follow-up Advised to continue fall precautions         Code Status: Full    Family Communication: Wife at bedside      Consultants: GI, , curbsided with cardiology.   Procedures: 2D echo.   Antimicrobials: Rocephin azithromycin     Discharge Exam: BP (!) 119/56 (BP Location: Right Arm)    Pulse 69    Temp 98 F (36.7 C)    Resp (!) 21    Ht $R'5\' 11"'pr$  (1.803 m)    Wt 72.6 kg    SpO2 91%    BMI 22.32 kg/m  General: 66 y.o. year-old male well developed well nourished in no acute distress.  Alert and oriented x3. Cardiovascular: Regular rate and rhythm with no rubs or gallops.  No thyromegaly or JVD noted.   Respiratory: Clear to auscultation with no wheezes or rales. Good inspiratory effort. Abdomen: Soft nontender nondistended with normal bowel sounds x4 quadrants. Musculoskeletal: No lower extremity edema bilaterally. Skin: No ulcerative lesions noted or rashes. Psychiatry: Mood is appropriate for condition and setting  Discharge Instructions You were cared for by a hospitalist during your hospital  stay. If you have any questions about your discharge medications or the care you received while you were in the hospital after you are discharged, you can call the unit and asked to speak with the hospitalist on call if the hospitalist that took care of you is not available. Once you are discharged, your primary care physician will handle any further medical issues. Please note that NO REFILLS for any discharge medications will be authorized once you are discharged, as it is imperative that you return to your primary care physician (or establish a relationship with a primary care physician if you do not have one) for your aftercare needs so that they can reassess your need for medications and monitor your lab values.   Allergies as of 09/21/2021   No Known Allergies      Medication List     STOP taking these medications    empagliflozin 25 MG Tabs tablet Commonly known as: JARDIANCE   naproxen 500 MG tablet Commonly known as: NAPROSYN   Ozempic (1 MG/DOSE) 2 MG/1.5ML Sopn Generic drug: Semaglutide (1 MG/DOSE)   telmisartan 40 MG tablet Commonly known as: MICARDIS       TAKE these medications    apixaban 5 MG  Tabs tablet Commonly known as: ELIQUIS Take 1 tablet (5 mg total) by mouth 2 (two) times daily.   aspirin 81 MG tablet Take 81 mg by mouth daily.   atorvastatin 40 MG tablet Commonly known as: LIPITOR Take 40 mg by mouth daily.   benzonatate 200 MG capsule Commonly known as: TESSALON Take 1 capsule (200 mg total) by mouth 3 (three) times daily as needed for cough.   DULoxetine 60 MG capsule Commonly known as: CYMBALTA Take 60 mg by mouth daily.   gabapentin 300 MG capsule Commonly known as: NEURONTIN Take 300 mg by mouth 3 (three) times daily.   metFORMIN 1000 MG tablet Commonly known as: GLUCOPHAGE Take 1,000 mg by mouth 2 (two) times daily with a meal.   metoprolol tartrate 25 MG tablet Commonly known as: LOPRESSOR Take 0.5 tablets (12.5 mg total) by  mouth 2 (two) times daily.   MULTIVITAMIN GUMMIES ADULTS PO Take 1 tablet by mouth daily.   pantoprazole 40 MG tablet Commonly known as: PROTONIX Take 1 tablet (40 mg total) by mouth 2 (two) times daily.   Tyler Aas FlexTouch 100 UNIT/ML FlexTouch Pen Generic drug: insulin degludec Inject 10 Units into the skin daily. What changed: how much to take       No Known Allergies  Follow-up Information     Ginger Organ., MD Follow up.   Specialty: Internal Medicine Why: Make appointment for follow up in 1 week Contact information: [address] [place] [address] [phone]         [facility] Follow up.   Specialty: Gastroenterology Why: Follow up as directed Contact information: [place] [address] [phone]        Adrian Prows, MD. Call today.   Specialty: Cardiology Why: Please call for a posthospital follow-up appointment. Contact information: [place] [address] [phone]         Yetta Flock, MD. Call today.   Specialty: Gastroenterology Why: Please call for a posthospital follow-up appointment. Contact information: [place]  [address] [phone]                  The results of significant diagnostics from this hospitalization (including imaging, microbiology, ancillary and laboratory) are listed below for reference.    Significant Diagnostic Studies: DG Chest 1 View  Result Date: 09/16/2021 CLINICAL DATA:  Cough.  Chest pain.  Vomiting. EXAM: CHEST  1 VIEW COMPARISON:  None. FINDINGS: The heart size and mediastinal contours are within normal limits. Both lungs are clear. The visualized skeletal structures are unremarkable. IMPRESSION: No active disease. Electronically Signed   By: Marlaine Hind M.D.   On: 09/16/2021 16:24   CT HEAD WO CONTRAST (5MM)  Result Date: 09/16/2021 CLINICAL DATA:  Weakness, headache,  emesis EXAM: CT HEAD WITHOUT CONTRAST TECHNIQUE: Contiguous axial images were obtained from the base of the skull through the vertex without intravenous contrast. RADIATION DOSE REDUCTION: This exam was performed according to the departmental dose-optimization program which includes automated exposure control, adjustment of the mA and/or kV according to patient size and/or use of iterative reconstruction technique. COMPARISON:  11/09/2016 FINDINGS: Brain: No acute infarct or hemorrhage. Lateral ventricles and midline structures are unremarkable. No acute extra-axial fluid collections. No mass effect. Vascular: No hyperdense vessel or unexpected calcification. Skull: Normal. Negative for fracture or focal lesion. Sinuses/Orbits: Gas fluid levels are seen within the bilateral maxillary sinuses. Moderate mucosal thickening within  the anterior ethmoid air cells. Other: None. IMPRESSION: 1. Acute bilateral maxillary sinusitis. 2. No acute intracranial process. Electronically Signed   By: Randa Ngo M.D.   On: 09/16/2021 23:10   ECHOCARDIOGRAM COMPLETE  Result Date: 09/20/2021    ECHOCARDIOGRAM REPORT   Patient Name:   KUNTA HILLEARY Date of Exam: 09/20/2021 Medical Rec #:  [id number]     Height:       71.0 in Accession #:    [id number]    Weight:       160.0 lb Date of Birth:  1955-12-05      BSA:          1.918 m Patient Age:    66 years      BP:           153/70 mmHg Patient Gender: M             HR:           66 bpm. Exam Location:  Inpatient Procedure: 2D Echo, Color Doppler and Cardiac Doppler Indications:    I48.91* Unspecified atrial fibrillation  History:        Patient has no prior history of Echocardiogram examinations.                 Arrythmias:Atrial Fibrillation; Risk Factors:Hypertension,                 Diabetes and Dyslipidemia.  Sonographer:    Raquel Sarna Senior RDCS Referring Phys: [id number] [place]  1. Left ventricular ejection fraction, by estimation, is 55 to 60%. The left ventricle  has normal function. The left ventricle has no regional wall motion abnormalities. Left ventricular diastolic parameters are indeterminate.  2. Right ventricular systolic function is normal. The right ventricular size is normal. Tricuspid regurgitation signal is inadequate for assessing PA pressure.  3. The mitral valve is normal in structure. Trivial mitral valve regurgitation. No evidence of mitral stenosis.  4. The aortic valve is tricuspid. Aortic valve regurgitation is not visualized. Aortic valve sclerosis/calcification is present, without any evidence of aortic stenosis.  5. The inferior vena cava is normal in size with greater than 50% respiratory variability, suggesting right atrial pressure of 3 mmHg. FINDINGS  Left Ventricle: Left ventricular ejection fraction, by estimation, is 55 to 60%. The left ventricle has normal function. The left ventricle has no regional wall motion abnormalities. The left ventricular internal cavity size was normal in size. There is  no left ventricular hypertrophy. Left ventricular diastolic parameters are indeterminate. Normal left ventricular filling pressure. Right Ventricle: The right ventricular size is normal. No increase in right ventricular wall thickness. Right ventricular systolic function is normal. Tricuspid regurgitation signal is inadequate for assessing PA pressure. Left Atrium: Left atrial size was normal in size. Right Atrium: Right atrial size was normal in size. Pericardium: There is no evidence of pericardial effusion. Mitral Valve: The mitral valve is normal in structure. Trivial mitral valve regurgitation. No evidence of mitral valve stenosis. Tricuspid Valve: The tricuspid valve is normal in structure. Tricuspid valve regurgitation is mild . No evidence of tricuspid stenosis. Aortic Valve: The aortic valve is tricuspid. Aortic valve regurgitation is not visualized. Aortic valve sclerosis/calcification is present, without any evidence of aortic stenosis.  Pulmonic Valve: The pulmonic valve was normal in structure. Pulmonic valve regurgitation is trivial. No evidence of pulmonic stenosis. Aorta: The aortic root is normal in size and structure. Venous: The inferior vena cava is normal in size with greater than 50%  respiratory variability, suggesting right atrial pressure of 3 mmHg. IAS/Shunts: No atrial level shunt detected by color flow Doppler.  LEFT VENTRICLE PLAX 2D LVIDd:         4.80 cm   Diastology LVIDs:         3.00 cm   LV e' medial:    7.18 cm/s LV PW:         1.20 cm   LV E/e' medial:  11.5 LV IVS:        0.70 cm   LV e' lateral:   10.60 cm/s LVOT diam:     2.00 cm   LV E/e' lateral: 7.8 LV SV:         79 LV SV Index:   41 LVOT Area:     3.14 cm  RIGHT VENTRICLE RV S prime:     13.80 cm/s TAPSE (M-mode): 2.5 cm LEFT ATRIUM             Index        RIGHT ATRIUM           Index LA diam:        3.70 cm 1.93 cm/m   RA Area:     15.60 cm LA Vol (A2C):   61.8 ml 32.22 ml/m  RA Volume:   38.80 ml  20.23 ml/m LA Vol (A4C):   42.4 ml 22.11 ml/m LA Biplane Vol: 52.2 ml 27.22 ml/m  AORTIC VALVE LVOT Vmax:   117.00 cm/s LVOT Vmean:  78.400 cm/s LVOT VTI:    0.253 m  AORTA Ao Root diam: 3.20 cm Ao Asc diam:  3.30 cm MITRAL VALVE MV Area (PHT): 3.15 cm    SHUNTS MV Decel Time: 241 msec    Systemic VTI:  0.25 m MV E velocity: 82.30 cm/s  Systemic Diam: 2.00 cm MV A velocity: 80.20 cm/s MV E/A ratio:  1.03 Fransico Him MD Electronically signed by Fransico Him MD Signature Date/Time: 09/20/2021/12:58:12 PM    Final    CT CHEST ABDOMEN PELVIS WO CONTRAST  Result Date: 09/16/2021 CLINICAL DATA:  Sepsis workup with history of diabetes. EXAM: CT CHEST, ABDOMEN AND PELVIS WITHOUT CONTRAST TECHNIQUE: Multidetector CT imaging of the chest, abdomen and pelvis was performed following the standard protocol without IV contrast. RADIATION DOSE REDUCTION: This exam was performed according to the departmental dose-optimization program which includes automated exposure control,  adjustment of the mA and/or kV according to patient size and/or use of iterative reconstruction technique. COMPARISON:  Portable chest today, CT abdomen and pelvis no contrast 05/27/2011. FINDINGS: CT CHEST FINDINGS Factors affecting image quality: Abundant patient motion mostly due to breathing limits both the chest and abdomen CT evaluation. Cardiovascular: The cardiac size is normal. There is no pericardial effusion. There is patchy calcification in the LAD and right coronary arteries. There is aortic calcific atherosclerosis in the arch and distal descending segment. The aorta is normal in caliber and course with scattered calcification in the great vessels. The pulmonary arteries and veins are normal caliber. Mediastinum/Nodes: Thyroid gland, axillary spaces are unremarkable. There are borderline sized precarinal, subcarinal and left hilar nodes up 9 mm in short axis but no enlarged nodes by size criteria. Trachea is unremarkable, as far as visualized. There is a patulous esophagus with retained or refluxed fluid in the upper to midportion, small hiatal hernia and mild circumferential thickening in the distal esophagus. Lungs/Pleura: There is patchy dense consolidation in the left lower lobe basal segments and some images suggest second and third  order endobronchial debris or fluid within the consolidated portions. On the right, there is patchy ground-glass consolidation in the upper lobe, greatest in the posterior segment, scattered ground-glass opacities in the lower lobe. The left upper and right middle lobes are clear. There is no pleural effusion, thickening or pneumothorax. Musculoskeletal: Slight thoracic dextroscoliosis. There is degenerative disc disease and spondylosis of the thoracic spine. Mild osteopenia. No spinal compression fracture is seen. No worrisome regional bone lesion. Ribcage is intact. CT ABDOMEN PELVIS FINDINGS Hepatobiliary: The liver not well seen due to breathing motion. It appears  to be mildly steatotic and measures approximately 18 cm length. There is no obvious mass. The gallbladder and bile ducts show no obvious abnormality. Pancreas: Also not well seen. No obvious focal abnormality or adjacent edema. Spleen: No focal abnormality or splenomegaly. Adrenals/Urinary Tract: There is no adrenal mass. There is a 4 mm nonobstructive caliceal stone in the superior pole of the left kidney and a few punctate nonobstructing caliceal stones in the left lower pole. No right intrarenal stone is seen and no obstructing stones or hydronephrosis either side. In the left lower pole there is a 3.2 cm cyst of 18.2 Hounsfield units which was previously 2.6 cm. There is increased bilateral perinephric stranding which could be chronic senescent change or inflammatory. The bladder is moderately distended but has a normal wall thickness. Stomach/Bowel: The stomach moderately distended with fluid. No obvious outlet obstructing mass is seen or duodenal dilatation. There is slight dilatation and thickening of jejunal segments up to 2.7 cm in diameter. The remainder of the small bowel is normal caliber. The appendix is normal. There is moderate stool retention ascending and transverse colon. No evidence of acute colitis or diverticulitis is seen through the breathing motion. Vascular/Lymphatic: Moderate aortoiliac atherosclerosis without AAA. No enlarged lymph nodes are seen. Reproductive: The prostate has enlarged compared to prior study now measuring 4.8 cm previously 4.6 cm. Calcifications are again noted in the right lobe. Other: No incarcerated hernia is seen and no free air, hemorrhage or fluid, or acute inflammatory changes are seen. Musculoskeletal: There is osteopenia and degenerative changes of the lumbar spine. Stable 1.1 cm sclerotic lesion in the posterior right ilium presumably bone island. Bilateral mild-to-moderate hip DJD is seen as well. Degenerative lumbar foraminal stenosis L4-5 and more so L5-S1.  IMPRESSION: 1. Opacities in the right upper and lower left lower lobes , most likely due to multilobar pneumonia. There could be an aspiration component to the left lower lobe infiltrates. Follow-up study recommended after treatment to ensure clearing. 2. Borderline sized lymph nodes in the mediastinum, left hilum. No encasing or bulky adenopathy or enlarged lymph nodes by strict size criteria. 3. Small hiatal hernia with a patulous esophagus with fluid retention or reflux in the thoracic esophagus and wall thickening in the distal esophagus. Consider endoscopic follow-up if clinically warranted. 4. Coronary artery and aortic atherosclerosis.  No AAA. 5. Fluid dilatation of the stomach, without duodenal dilatation, could be due to a recent ingestion or impaired gastric emptying. 6. Slightly prominent thickened jejunal segments. Remaining small bowel is normal caliber. Findings could be due to a low-grade jejunal partial obstruction or ileus/enteritis. 7. Constipation and diverticulosis. 8. Fatty liver. 9. Nonobstructive nephrolithiasis on the left. No obstructing stone is seen. Increased perinephric stranding could be chronic change or inflammatory. 10. Prostatomegaly. 11. Osteopenia and degenerative change. Electronically Signed   By: Telford Nab M.D.   On: 09/16/2021 23:29    Microbiology: Recent Results (from the past  240 hour(s))  Urine Culture     Status: None   Collection Time: 09/16/21  3:29 PM   Specimen: In/Out Cath Urine  Result Value Ref Range Status   Specimen Description IN/OUT CATH URINE  Final   Special Requests NONE  Final   Culture   Final    NO GROWTH Performed at [facility], [place] [address], [place],  [address]    Report Status 09/18/2021 FINAL  Final  Resp Panel by RT-PCR (Flu A&B, Covid) Peripheral     Status: None   Collection Time: 09/16/21  3:30 PM   Specimen: Peripheral; Nasopharyngeal(NP) swabs in vial transport medium  Result Value Ref Range Status    SARS Coronavirus 2 by RT PCR NEGATIVE NEGATIVE Final    Comment: (NOTE) SARS-CoV-2 target nucleic acids are NOT DETECTED.  The SARS-CoV-2 RNA is generally detectable in upper respiratory specimens during the acute phase of infection. The lowest concentration of SARS-CoV-2 viral copies this assay can detect is 138 copies/mL. A negative result does not preclude SARS-Cov-2 infection and should not be used as the sole basis for treatment or other patient management decisions. A negative result may occur with  improper specimen collection/handling, submission of specimen other than nasopharyngeal swab, presence of viral mutation(s) within the areas targeted by this assay, and inadequate number of viral copies(<138 copies/mL). A negative result must be combined with clinical observations, patient history, and epidemiological information. The expected result is Negative.  Fact Sheet for Patients:  EntrepreneurPulse.com.au  Fact Sheet for Healthcare Providers:  IncredibleEmployment.be  This test is no t yet approved or cleared by the [place] FDA and  has been authorized for detection and/or diagnosis of SARS-CoV-2 by FDA under an Emergency Use Authorization (EUA). This EUA will remain  in effect (meaning this test can be used) for the duration of the COVID-19 declaration under Section 564(b)(1) of the Act, 21 U.S.C.section 360bbb-3(b)(1), unless the authorization is terminated  or revoked sooner.       Influenza A by PCR NEGATIVE NEGATIVE Final   Influenza B by PCR NEGATIVE NEGATIVE Final    Comment: (NOTE) The Xpert Xpress SARS-CoV-2/FLU/RSV plus assay is intended as an aid in the diagnosis of influenza from Nasopharyngeal swab specimens and should not be used as a sole basis for treatment. Nasal washings and aspirates are unacceptable for Xpert Xpress SARS-CoV-2/FLU/RSV testing.  Fact Sheet for  Patients: EntrepreneurPulse.com.au  Fact Sheet for Healthcare Providers: IncredibleEmployment.be  This test is not yet approved or cleared by the [place] FDA and has been authorized for detection and/or diagnosis of SARS-CoV-2 by FDA under an Emergency Use Authorization (EUA). This EUA will remain in effect (meaning this test can be used) for the duration of the COVID-19 declaration under Section 564(b)(1) of the Act, 21 U.S.C. section 360bbb-3(b)(1), unless the authorization is terminated or revoked.  Performed at [facility], [place] [address], [place], [place] [address]   Respiratory (~20 pathogens) panel by PCR     Status: Abnormal   Collection Time: 09/16/21  3:30 PM   Specimen: Nasopharyngeal Swab; Respiratory  Result Value Ref Range Status   Adenovirus NOT DETECTED NOT DETECTED Final   Coronavirus 229E NOT DETECTED NOT DETECTED Final    Comment: (NOTE) The Coronavirus on the Respiratory Panel, DOES NOT test for the novel  Coronavirus (2019 nCoV)    Coronavirus HKU1 NOT DETECTED NOT DETECTED Final   Coronavirus NL63 NOT DETECTED NOT DETECTED Final   Coronavirus OC43 NOT  DETECTED NOT DETECTED Final   Metapneumovirus NOT DETECTED NOT DETECTED Final   Rhinovirus / Enterovirus DETECTED (A) NOT DETECTED Final   Influenza A NOT DETECTED NOT DETECTED Final   Influenza B NOT DETECTED NOT DETECTED Final   Parainfluenza Virus 1 NOT DETECTED NOT DETECTED Final   Parainfluenza Virus 2 NOT DETECTED NOT DETECTED Final   Parainfluenza Virus 3 NOT DETECTED NOT DETECTED Final   Parainfluenza Virus 4 NOT DETECTED NOT DETECTED Final   Respiratory Syncytial Virus NOT DETECTED NOT DETECTED Final   Bordetella pertussis NOT DETECTED NOT DETECTED Final   Bordetella Parapertussis NOT DETECTED NOT DETECTED Final   Chlamydophila pneumoniae NOT DETECTED NOT DETECTED Final   Mycoplasma pneumoniae NOT DETECTED NOT DETECTED Final    Comment: Performed at  [facility], [place] [address] [place], [place] [address]  Blood Culture (routine x 2)     Status: None   Collection Time: 09/16/21  3:47 PM   Specimen: BLOOD  Result Value Ref Range Status   Specimen Description BLOOD SITE NOT SPECIFIED  Final   Special Requests   Final    BOTTLES DRAWN AEROBIC AND ANAEROBIC Blood Culture adequate volume   Culture   Final    NO GROWTH 5 DAYS Performed at [facility], [address], [place], [place] [address]    Report Status 09/21/2021 FINAL  Final  Blood Culture (routine x 2)     Status: None   Collection Time: 09/16/21  7:58 PM   Specimen: BLOOD RIGHT ARM  Result Value Ref Range Status   Specimen Description BLOOD RIGHT ARM  Final   Special Requests   Final    BOTTLES DRAWN AEROBIC AND ANAEROBIC Blood Culture adequate volume   Culture   Final    NO GROWTH 5 DAYS Performed at [facility], [place] [address], [place], [place] [address]    Report Status 09/21/2021 FINAL  Final     Labs: Basic Metabolic Panel: Recent Labs  Lab 09/17/21 0105 09/17/21 0339 09/17/21 2308 09/17/21 2321 09/18/21 1506 09/18/21 1836 09/19/21 0140 09/19/21 0543 09/20/21 0626 09/21/21 0551  NA 132*   < >  --    < > 134* 136 135  --  137 134*  K 4.7   < >  --    < > 3.7 3.7 3.9  --  3.4* 3.8  CL 102   < >  --    < > 106 105 103  --  103 101  CO2 7*   < >  --    < > 20* 19* 20*  --  19* 21*  GLUCOSE 280*   < >  --    < > 195* 136* 138*  --  122* 132*  BUN 45*   < >  --    < > $R'15 16 15  'xk$ --  14 10  CREATININE 1.58*   < >  --    < > 0.75 0.72 0.74  --  0.73 0.66  CALCIUM 9.0   < >  --    < > 9.1 9.3 9.2  --  8.6* 8.5*  MG 2.8*  --  2.5*  --   --   --   --  2.1  --  1.9  PHOS 3.4  --   --   --   --   --   --  3.0  --   --    < > = values in  this interval not displayed.   Liver Function Tests: Recent Labs  Lab 09/16/21 1547 09/17/21 0105  AST 12* 14*  ALT 16 13  ALKPHOS 119 95  BILITOT 1.1 1.6*  PROT 8.7* 6.7  ALBUMIN 4.1 2.9*   Recent  Labs  Lab 09/16/21 2229  LIPASE 56*   No results for input(s): AMMONIA in the last 168 hours. CBC: Recent Labs  Lab 09/16/21 1547 09/16/21 2237 09/17/21 0105 09/17/21 0357 09/17/21 1903 09/18/21 0910 09/19/21 0543 09/20/21 0626 09/21/21 0551  WBC 22.3*  --  16.9*  --   --  14.6* 11.7* 11.0* 12.0*  NEUTROABS 20.2*  --  15.4*  --   --  11.2* 8.9*  --   --   HGB 17.0   < > 14.5   < > 13.6 13.0 14.0 12.7* 12.9*  HCT 51.2   < > 44.8   < > 40.0 38.5* 39.6 36.5* 37.0*  MCV 92.1  --  92.8  --   --  89.1 87.8 87.3 87.5  PLT 397  --  294  --   --  253 242 249 259   < > = values in this interval not displayed.   Cardiac Enzymes: Recent Labs  Lab 09/17/21 0105  CKTOTAL 59   BNP: BNP (last 3 results) Recent Labs    09/19/21 0543  BNP 151.1*    ProBNP (last 3 results) No results for input(s): PROBNP in the last 8760 hours.  CBG: Recent Labs  Lab 09/20/21 0753 09/20/21 1157 09/20/21 1554 09/20/21 2005 09/21/21 0818  GLUCAP 157* 113* 206* 195* 126*       Signed:  Kayleen Memos, MD [facility] 09/21/2021, 10:35 AM

## 2021-09-21 NOTE — Progress Notes (Incomplete)
RN + patient family + NT at bedside. Patient appears calm and doesn't complain of pain. Patient  telemonitoring connections + IV removed per unit policy

## 2021-09-23 NOTE — Telephone Encounter (Signed)
Appointment made

## 2021-09-25 ENCOUNTER — Other Ambulatory Visit: Payer: Self-pay

## 2021-09-25 ENCOUNTER — Ambulatory Visit: Payer: HMO | Admitting: Cardiology

## 2021-09-25 ENCOUNTER — Encounter: Payer: Self-pay | Admitting: Cardiology

## 2021-09-25 VITALS — BP 106/64 | HR 62 | Temp 97.9°F | Resp 16 | Ht 71.0 in | Wt 172.2 lb

## 2021-09-25 DIAGNOSIS — E1142 Type 2 diabetes mellitus with diabetic polyneuropathy: Secondary | ICD-10-CM | POA: Diagnosis not present

## 2021-09-25 DIAGNOSIS — I2510 Atherosclerotic heart disease of native coronary artery without angina pectoris: Secondary | ICD-10-CM

## 2021-09-25 DIAGNOSIS — I10 Essential (primary) hypertension: Secondary | ICD-10-CM | POA: Diagnosis not present

## 2021-09-25 DIAGNOSIS — Z8639 Personal history of other endocrine, nutritional and metabolic disease: Secondary | ICD-10-CM | POA: Diagnosis not present

## 2021-09-25 DIAGNOSIS — Z794 Long term (current) use of insulin: Secondary | ICD-10-CM | POA: Diagnosis not present

## 2021-09-25 DIAGNOSIS — I480 Paroxysmal atrial fibrillation: Secondary | ICD-10-CM | POA: Diagnosis not present

## 2021-09-25 DIAGNOSIS — D6869 Other thrombophilia: Secondary | ICD-10-CM | POA: Diagnosis not present

## 2021-09-25 DIAGNOSIS — E7800 Pure hypercholesterolemia, unspecified: Secondary | ICD-10-CM | POA: Diagnosis not present

## 2021-09-25 DIAGNOSIS — E1139 Type 2 diabetes mellitus with other diabetic ophthalmic complication: Secondary | ICD-10-CM | POA: Diagnosis not present

## 2021-09-25 DIAGNOSIS — J13 Pneumonia due to Streptococcus pneumoniae: Secondary | ICD-10-CM | POA: Diagnosis not present

## 2021-09-25 MED ORDER — EZETIMIBE 10 MG PO TABS: 10.0000 mg | ORAL_TABLET | Freq: Every day | ORAL | 3 refills | Status: DC

## 2021-09-25 NOTE — Progress Notes (Signed)
Primary Physician/Referring:  Ginger Organ., MD  Patient ID: Willie [name], male    DOB: [birth year], 66 y.o.   MRN: [id number]  No chief complaint on file.  HPI:    Willie [name]  is a 66 y.o. Patient admitted with UTI, ileus and community-acquired pneumonia and severe sepsis on 09/16/2021 to the hospital along with diabetic ketoacidosis.  Patient developed transient atrial fibrillation and converted to sinus rhythm within a day and was started on Eliquis and discharged home and now he presents for follow-up.  Past medical history is significant for hypertension, hyperlipidemia, diabetes mellitus with peripheral neuropathy.  Patient and his wife present.  He is asymptomatic except still feels slightly tired and short of breath with activity since the episode.  Past Medical History:  Diagnosis Date   Adenomatous polyp    Atherosclerosis of aorta (HCC)    Cataract    Diabetes mellitus without complication (HCC)    Hearing loss, right    Hyperlipidemia    Hypertension    MRSA infection    Past Surgical History:  Procedure Laterality Date   Cervical Lymph Node Skin Biopsy Left    COLONOSCOPY     CYST REMOVAL LEG     DG THUMB LEFT HAND     INCISE AND DRAIN ABCESS     thumb   POLYPECTOMY     Family History  Problem Relation Age of Onset   Diabetes Mother    Parkinson's disease Mother    Lung cancer Father    Heart attack Father    Breast cancer Sister    CAD Brother    Diabetes Maternal Grandmother    Lung cancer Paternal Grandfather    Colon cancer Neg Hx    Stomach cancer Neg Hx    Colon polyps Neg Hx    Esophageal cancer Neg Hx    Rectal cancer Neg Hx     Social History   Tobacco Use   Smoking status: Former    Packs/day: 1.00    Types: Cigarettes    Quit date: 1982    Years since quitting: 41.1   Smokeless tobacco: Never  Substance Use Topics   Alcohol use: Yes    Comment: occasional wine   Marital Status: Married  ROS  Review of Systems   Constitutional: Positive for malaise/fatigue.  Cardiovascular:  Positive for dyspnea on exertion. Negative for chest pain and leg swelling.  Objective  Blood pressure 106/64, pulse 62, temperature 97.9 F (36.6 C), temperature source Temporal, resp. rate 16, height 5\' 11"  (1.803 m), weight 172 lb 3.2 oz (78.1 kg), SpO2 94 %. Body mass index is 24.02 kg/m.  Vitals with BMI 09/25/2021 09/21/2021 09/21/2021  Height 5\' 11"  - -  Weight 172 lbs 3 oz - -  BMI 37.10 - -  Systolic [phone]  Diastolic 64 56 69  Pulse 62 69 68    Physical Exam Neck:     Vascular: No carotid bruit or JVD.  Cardiovascular:     Rate and Rhythm: Normal rate and regular rhythm.     Pulses: Intact distal pulses.     Heart sounds: Normal heart sounds. No murmur heard.   No gallop.  Pulmonary:     Effort: Pulmonary effort is normal.     Breath sounds: Normal breath sounds.  Abdominal:     General: Bowel sounds are normal.     Palpations: Abdomen is soft.  Musculoskeletal:     Right lower leg: No edema.  Left lower leg: No edema.     Medications and allergies  No Known Allergies   Medication list after today's encounter    Current Outpatient Medications:    apixaban (ELIQUIS) 5 MG TABS tablet, Take 1 tablet (5 mg total) by mouth 2 (two) times daily., Disp: 120 tablet, Rfl: 0   aspirin 81 MG tablet, Take 81 mg by mouth daily., Disp: , Rfl:    atorvastatin (LIPITOR) 40 MG tablet, Take 40 mg by mouth daily., Disp: , Rfl:    benzonatate (TESSALON) 200 MG capsule, Take 1 capsule (200 mg total) by mouth 3 (three) times daily as needed for cough., Disp: 20 capsule, Rfl: 0   DULoxetine (CYMBALTA) 60 MG capsule, Take 60 mg by mouth daily., Disp: , Rfl:    ezetimibe (ZETIA) 10 MG tablet, Take 1 tablet (10 mg total) by mouth daily after supper., Disp: 90 tablet, Rfl: 3   gabapentin (NEURONTIN) 300 MG capsule, Take 300 mg by mouth 3 (three) times daily., Disp: , Rfl:    metFORMIN (GLUCOPHAGE) 1000 MG tablet, Take  1,000 mg by mouth 2 (two) times daily with a meal., Disp: , Rfl:    metoprolol tartrate (LOPRESSOR) 25 MG tablet, Take 0.5 tablets (12.5 mg total) by mouth 2 (two) times daily., Disp: 30 tablet, Rfl: 0   Multiple Vitamins-Minerals (MULTIVITAMIN GUMMIES ADULTS PO), Take 1 tablet by mouth daily., Disp: , Rfl:    pantoprazole (PROTONIX) 40 MG tablet, Take 1 tablet (40 mg total) by mouth 2 (two) times daily., Disp: 60 tablet, Rfl: 0   TRESIBA FLEXTOUCH 100 UNIT/ML FlexTouch Pen, Inject 10 Units into the skin daily., Disp: 3 mL, Rfl: 0  Laboratory examination:   Recent Labs    09/19/21 0140 09/20/21 0626 09/21/21 0551  NA 135 137 134*  K 3.9 3.4* 3.8  CL 103 103 101  CO2 20* 19* 21*  GLUCOSE 138* 122* 132*  BUN 15 14 10   CREATININE 0.74 0.73 0.66  CALCIUM 9.2 8.6* 8.5*  GFRNONAA >60 >60 >60   estimated creatinine clearance is 95.4 mL/min (by C-G formula based on SCr of 0.66 mg/dL).  CMP Latest Ref Rng & Units 09/21/2021 09/20/2021 09/19/2021  Glucose 70 - 99 mg/dL 132(H) 122(H) 138(H)  BUN 8 - 23 mg/dL 10 14 15   Creatinine 0.61 - 1.24 mg/dL 0.66 0.73 0.74  Sodium 135 - 145 mmol/L 134(L) 137 135  Potassium 3.5 - 5.1 mmol/L 3.8 3.4(L) 3.9  Chloride 98 - 111 mmol/L 101 103 103  CO2 22 - 32 mmol/L 21(L) 19(L) 20(L)  Calcium 8.9 - 10.3 mg/dL 8.5(L) 8.6(L) 9.2  Total Protein 6.5 - 8.1 g/dL - - -  Total Bilirubin 0.3 - 1.2 mg/dL - - -  Alkaline Phos 38 - 126 U/L - - -  AST 15 - 41 U/L - - -  ALT 0 - 44 U/L - - -   CBC Latest Ref Rng & Units 09/21/2021 09/20/2021 09/19/2021  WBC 4.0 - 10.5 K/uL 12.0(H) 11.0(H) 11.7(H)  Hemoglobin 13.0 - 17.0 g/dL 12.9(L) 12.7(L) 14.0  Hematocrit 39.0 - 52.0 % 37.0(L) 36.5(L) 39.6  Platelets 150 - 400 K/uL 259 249 242    Lipid Panel No results for input(s): CHOL, TRIG, LDLCALC, VLDL, HDL, CHOLHDL, LDLDIRECT in the last 8760 hours. Lipid Panel  No results found for: CHOL, TRIG, HDL, CHOLHDL, VLDL, LDLCALC, LDLDIRECT, LABVLDL   HEMOGLOBIN A1C Lab  Results  Component Value Date   HGBA1C 7.7 (H) 09/17/2021   MPG 174.29 09/17/2021  TSH Recent Labs    09/17/21 0105  TSH 0.451    External labs:   Cholesterol, total 133.000 m 12/20/2020 HDL 33.000 mg 12/20/2020 LDL 82.000 mg 12/20/2020 Triglycerides 91.000 mg 12/20/2020 A1C 7.000 06/30/2021  Radiology:   CT chest and abdomen without contrast 09/16/2021: Cardiovascular: The cardiac size is normal. There is no pericardial effusion. There is patchy calcification in the LAD and right coronary arteries. There is aortic calcific atherosclerosis in the arch and distal descending segment. The aorta is normal in caliber and course with scattered calcification in the great vessels. The pulmonary arteries and veins are normal caliber. Opacities in the right upper and lower lobe on the left, multilobar pneumonia. Small hiatal hernia. Diverticulosis and fatty liver. Nonobstructing nephrolithiasis.     Cardiac Studies:   No results found for this or any previous visit from the past 1095 days.     No results found for this or any previous visit from the past 1095 days.  Echocardiogram 09/20/2021:  1. Left ventricular ejection fraction, by estimation, is 55 to 60%. The left ventricle has normal function. The left ventricle has no regional wall motion abnormalities. Left ventricular diastolic parameters are indeterminate.  2. Right ventricular systolic function is normal. The right ventricular size is normal. Tricuspid regurgitation signal is inadequate for assessing PA pressure.  3. The mitral valve is normal in structure. Trivial mitral valve regurgitation. No evidence of mitral stenosis.  4. The aortic valve is tricuspid. Aortic valve regurgitation is not visualized. Aortic valve sclerosis/calcification is present, without any evidence of aortic stenosis.  5. The inferior vena cava is normal in size with greater than 50% respiratory variability, suggesting right atrial pressure of 3  mmHg.  EKG:   EKG 09/25/2021: Normal sinus rhythm with rate of 62 bpm, normal axis.  No evidence of ischemia, normal EKG.    EKG 09/16/2021: Sinus tachycardia at a rate of 1 112 bpm.  EKG 09/17/2021: Atrial fibrillation with rapid ventricular response at rate of 139 bpm, nonspecific T abnormality.  Assessment     ICD-10-CM   1. Primary hypertension  I10 EKG 12-Lead    2. Paroxysmal atrial fibrillation (HCC)  I48.0 PCV MYOCARDIAL PERFUSION WO LEXISCAN    3. Coronary artery calcification seen on CAT scan  I25.10 PCV MYOCARDIAL PERFUSION WO LEXISCAN    4. Pure hypercholesterolemia  E78.00 ezetimibe (ZETIA) 10 MG tablet      CHA2DS2-VASc Score is 4.  Yearly risk of stroke: 4.8% (A, HTN, DM, Atherosclerosis).  Score of 1=0.6; 2=2.2; 3=3.2; 4=4.8; 5=7.2; 6=9.8; 7=>9.8) -(CHF; HTN; vasc disease DM,  Male = 1; Age <65 =0; 65-74 = 1,  >75 =2; stroke/embolism= 2).    There are no discontinued medications.  Meds ordered this encounter  Medications   ezetimibe (ZETIA) 10 MG tablet    Sig: Take 1 tablet (10 mg total) by mouth daily after supper.    Dispense:  90 tablet    Refill:  3   Orders Placed This Encounter  Procedures   PCV MYOCARDIAL PERFUSION WO LEXISCAN    Standing Status:   Future    Standing Expiration Date:   11/23/2021   EKG 12-Lead   Recommendations:   Tennyson Wacha is a 66 y.o.  Patient admitted with UTI, ileus and community-acquired pneumonia and severe sepsis on 09/16/2021 to the hospital along with diabetic ketoacidosis.  Patient developed transient atrial fibrillation and converted to sinus rhythm within a day and was started on Eliquis and discharged home and now  he presents for follow-up.  Past medical history is significant for hypertension, hyperlipidemia, diabetes mellitus with peripheral neuropathy.  Patient and his wife present, extensive discussion was held regarding single episode of atrial fibrillation in the setting of severe sepsis and dehydration.  His  CHA2DS2-VASc risk is relatively high however echocardiogram did not reveal any significant or severe left atrial enlargement, EKG is essentially normal, he is not obese and does not have sleep apnea.  After long discussions, he would like to think whether he wants to continue anticoagulation or discontinue this.  He is also considering OCEANIC-AF trial with factor XIa inhibitor.  I reviewed his imaging studies, patient also has very complex and significant amount of LAD calcification.  He needs ischemic work-up, nuclear stress test will be set up.  In view of coronary atherosclerosis, diabetes, LDL goal <70, will add Zetia to the present medical regimen.  Labs reviewed.  Otherwise blood pressure is well controlled, he remains relatively active, I will see him back in 6 weeks for follow-up.  This was a 60-minute office visit encounter with review of hospital records, discussions regarding above-stated medical condition and long discussion regarding shared decision making for anticoagulation.    Adrian Prows, MD, [facility] 09/25/2021, 11:03 AM Office: [phone]

## 2021-10-02 NOTE — Progress Notes (Signed)
°   09/19/21 0504  Assess: MEWS Score  BP (!) 138/92  Pulse Rate (!) 105  ECG Heart Rate (!) 124  Resp (!) 26  SpO2 93 %  O2 Device Room Air  Assess: MEWS Score  MEWS Temp 0  MEWS Systolic 0  MEWS Pulse 2  MEWS RR 2  MEWS LOC 0  MEWS Score 4  MEWS Score Color Red  Assess: if the MEWS score is Yellow or Red  Were vital signs taken at a resting state? Yes  Focused Assessment No change from prior assessment  Early Detection of Sepsis Score *See Row Information* High  MEWS guidelines implemented *See Row Information* Yes  Treat  MEWS Interventions Escalated (See documentation below)  Take Vital Signs  Increase Vital Sign Frequency  Red: Q 1hr X 4 then Q 4hr X 4, if remains red, continue Q 4hrs  Escalate  MEWS: Escalate Red: discuss with charge nurse/RN and provider, consider discussing with RRT  Notify: Charge Nurse/RN  Name of Charge Nurse/RN Notified April RN  Date Charge Nurse/RN Notified 09/19/21  Time Charge Nurse/RN Notified 0448

## 2021-10-08 DIAGNOSIS — Z794 Long term (current) use of insulin: Secondary | ICD-10-CM | POA: Diagnosis not present

## 2021-10-08 DIAGNOSIS — I10 Essential (primary) hypertension: Secondary | ICD-10-CM | POA: Diagnosis not present

## 2021-10-08 DIAGNOSIS — E782 Mixed hyperlipidemia: Secondary | ICD-10-CM | POA: Diagnosis not present

## 2021-10-08 DIAGNOSIS — E1139 Type 2 diabetes mellitus with other diabetic ophthalmic complication: Secondary | ICD-10-CM | POA: Diagnosis not present

## 2021-10-08 DIAGNOSIS — G629 Polyneuropathy, unspecified: Secondary | ICD-10-CM | POA: Diagnosis not present

## 2021-10-08 DIAGNOSIS — I480 Paroxysmal atrial fibrillation: Secondary | ICD-10-CM | POA: Diagnosis not present

## 2021-10-14 ENCOUNTER — Encounter (HOSPITAL_COMMUNITY): Payer: Self-pay | Admitting: Radiology

## 2021-10-15 ENCOUNTER — Ambulatory Visit: Payer: HMO

## 2021-10-15 ENCOUNTER — Other Ambulatory Visit: Payer: Self-pay

## 2021-10-15 DIAGNOSIS — I480 Paroxysmal atrial fibrillation: Secondary | ICD-10-CM

## 2021-10-15 DIAGNOSIS — I2510 Atherosclerotic heart disease of native coronary artery without angina pectoris: Secondary | ICD-10-CM | POA: Diagnosis not present

## 2021-10-19 LAB — PCV MYOCARDIAL PERFUSION WO LEXISCAN
Angina Index: 0
Base ST Depression (mm): 0 mm
ST Elevation (mm): 1 mm

## 2021-10-22 DIAGNOSIS — D692 Other nonthrombocytopenic purpura: Secondary | ICD-10-CM | POA: Diagnosis not present

## 2021-10-22 DIAGNOSIS — L308 Other specified dermatitis: Secondary | ICD-10-CM | POA: Diagnosis not present

## 2021-10-22 DIAGNOSIS — L918 Other hypertrophic disorders of the skin: Secondary | ICD-10-CM | POA: Diagnosis not present

## 2021-10-22 DIAGNOSIS — L821 Other seborrheic keratosis: Secondary | ICD-10-CM | POA: Diagnosis not present

## 2021-10-22 DIAGNOSIS — L270 Generalized skin eruption due to drugs and medicaments taken internally: Secondary | ICD-10-CM | POA: Diagnosis not present

## 2021-10-22 DIAGNOSIS — L570 Actinic keratosis: Secondary | ICD-10-CM | POA: Diagnosis not present

## 2021-10-22 DIAGNOSIS — D1801 Hemangioma of skin and subcutaneous tissue: Secondary | ICD-10-CM | POA: Diagnosis not present

## 2021-11-05 ENCOUNTER — Ambulatory Visit: Payer: HMO | Admitting: Cardiology

## 2021-11-05 ENCOUNTER — Encounter: Payer: Self-pay | Admitting: Cardiology

## 2021-11-05 VITALS — BP 127/68 | HR 78 | Temp 97.0°F | Resp 16 | Ht 71.0 in | Wt 182.0 lb

## 2021-11-05 DIAGNOSIS — I2510 Atherosclerotic heart disease of native coronary artery without angina pectoris: Secondary | ICD-10-CM

## 2021-11-05 DIAGNOSIS — E7800 Pure hypercholesterolemia, unspecified: Secondary | ICD-10-CM

## 2021-11-05 DIAGNOSIS — I480 Paroxysmal atrial fibrillation: Secondary | ICD-10-CM

## 2021-11-05 MED ORDER — METOPROLOL TARTRATE 25 MG PO TABS: 12.5000 mg | ORAL_TABLET | Freq: Two times a day (BID) | ORAL | 3 refills | Status: AC

## 2021-11-05 MED ORDER — ATORVASTATIN CALCIUM 40 MG PO TABS: 40.0000 mg | ORAL_TABLET | Freq: Every day | ORAL | 3 refills | Status: AC

## 2021-11-05 NOTE — Progress Notes (Signed)
? ?Primary Physician/Referring:  Ginger Organ., MD ? ?Patient ID: Willie [name], male    DOB: 12-12-1955, 66 y.o.   MRN: [id number] ? ?Chief Complaint  ?Patient presents with  ? Hypertension  ? Follow-up  ?  6 weeks  ? ? ?HPI:   ? ?Willie [name]  is a 66 y.o. Patient admitted with UTI, ileus and community-acquired pneumonia and severe sepsis on 09/16/2021 to the hospital along with diabetic ketoacidosis.  Patient developed transient atrial fibrillation and converted to sinus rhythm within a day and was started on Eliquis and discharged home. Past medical history is significant for hypertension, hyperlipidemia, diabetes mellitus with peripheral neuropathy.  ? ?Patient presents for 6-week follow-up. At last office visit added Zetia as well as nuclear stress test which showed normal myocardial perfusion but with EKG changes suggestive of ischemia.  Also last office visit patient discussed with Dr. Einar Gip desire to stop anticoagulation if possible.  Patient has also stopped taking atorvastatin for an unknown reason. ? ?At today's office visit patient complains of palpitations which are brief and primarily at rest.  Denies chest pain, dyspnea, dizziness.  Is tolerating addition of Zetia without issue. ? ?Past Medical History:  ?Diagnosis Date  ? Adenomatous polyp   ? Atherosclerosis of aorta ([place])   ? Cataract   ? Diabetes mellitus without complication ([place])   ? Hearing loss, right   ? Hyperlipidemia   ? Hypertension   ? MRSA infection   ? ?Past Surgical History:  ?Procedure Laterality Date  ? Cervical Lymph Node Skin Biopsy Left   ? COLONOSCOPY    ? CYST REMOVAL LEG    ? DG THUMB LEFT HAND    ? INCISE AND DRAIN ABCESS    ? thumb  ? POLYPECTOMY    ? ?Family History  ?Problem Relation Age of Onset  ? Diabetes Mother   ? Parkinson's disease Mother   ? Lung cancer Father   ? Heart attack Father   ? Breast cancer Sister   ? CAD Brother   ? Diabetes Maternal Grandmother   ? Lung cancer Paternal Grandfather   ? Colon cancer  Neg Hx   ? Stomach cancer Neg Hx   ? Colon polyps Neg Hx   ? Esophageal cancer Neg Hx   ? Rectal cancer Neg Hx   ?  ?Social History  ? ?Tobacco Use  ? Smoking status: Former  ?  Packs/day: 1.00  ?  Years: 15.00  ?  Pack years: 15.00  ?  Types: Cigarettes  ?  Quit date: 59  ?  Years since quitting: 41.2  ? Smokeless tobacco: Never  ?Substance Use Topics  ? Alcohol use: Yes  ?  Comment: occasional wine  ? ?Marital Status: Married  ?ROS  ?Review of Systems  ?Cardiovascular:  Positive for palpitations. Negative for chest pain, dyspnea on exertion and leg swelling.  ?Objective  ?Blood pressure 127/68, pulse 78, temperature (!) 97 ?F (36.1 ?C), temperature source Temporal, resp. rate 16, height '5\' 11"'$  (1.803 m), weight 182 lb (82.6 kg), SpO2 95 %. Body mass index is 25.38 kg/m?.  ? ?  11/05/2021  ?  2:39 PM 09/25/2021  ? 10:05 AM 09/21/2021  ?  3:20 AM  ?Vitals with BMI  ?Height '5\' 11"'$  '5\' 11"'$    ?Weight 182 lbs 172 lbs 3 oz   ?BMI 25.4 24.03   ?Systolic 762 263 335  ?Diastolic 68 64 56  ?Pulse 78 62 69  ?  ?Physical Exam ?Neck:  ?  Vascular: No carotid bruit or JVD.  ?Cardiovascular:  ?   Rate and Rhythm: Normal rate and regular rhythm.  ?   Pulses: Intact distal pulses.  ?   Heart sounds: Normal heart sounds. No murmur heard. ?  No gallop.  ?Pulmonary:  ?   Effort: Pulmonary effort is normal.  ?   Breath sounds: Normal breath sounds.  ?Abdominal:  ?   General: Bowel sounds are normal.  ?   Palpations: Abdomen is soft.  ?Musculoskeletal:  ?   Right lower leg: No edema.  ?   Left lower leg: No edema.  ?Physical exam unchanged compared to previous office visit. ? ?Allergies  ?No Known Allergies  ? ?Medications Prior to Visit:  ? ?Outpatient Medications Prior to Visit  ?Medication Sig Dispense Refill  ? apixaban (ELIQUIS) 5 MG TABS tablet Take 1 tablet (5 mg total) by mouth 2 (two) times daily. 120 tablet 0  ? aspirin 81 MG tablet Take 81 mg by mouth daily.    ? DULoxetine (CYMBALTA) 60 MG capsule Take 60 mg by mouth daily.     ? ezetimibe (ZETIA) 10 MG tablet Take 1 tablet (10 mg total) by mouth daily after supper. 90 tablet 3  ? gabapentin (NEURONTIN) 300 MG capsule Take 300 mg by mouth 2 (two) times daily.    ? metFORMIN (GLUCOPHAGE) 1000 MG tablet Take 1,000 mg by mouth 2 (two) times daily with a meal.    ? Multiple Vitamins-Minerals (MULTIVITAMIN GUMMIES ADULTS PO) Take 1 tablet by mouth daily.    ? atorvastatin (LIPITOR) 40 MG tablet Take 40 mg by mouth daily. (Patient not taking: Reported on 11/05/2021)    ? benzonatate (TESSALON) 200 MG capsule Take 1 capsule (200 mg total) by mouth 3 (three) times daily as needed for cough. 20 capsule 0  ? metoprolol tartrate (LOPRESSOR) 25 MG tablet Take 0.5 tablets (12.5 mg total) by mouth 2 (two) times daily. 30 tablet 0  ? pantoprazole (PROTONIX) 40 MG tablet Take 1 tablet (40 mg total) by mouth 2 (two) times daily. 60 tablet 0  ? ?No facility-administered medications prior to visit.  ? ?Final Medications at End of Visit   ? ?Current Meds  ?Medication Sig  ? apixaban (ELIQUIS) 5 MG TABS tablet Take 1 tablet (5 mg total) by mouth 2 (two) times daily.  ? aspirin 81 MG tablet Take 81 mg by mouth daily.  ? DULoxetine (CYMBALTA) 60 MG capsule Take 60 mg by mouth daily.  ? ezetimibe (ZETIA) 10 MG tablet Take 1 tablet (10 mg total) by mouth daily after supper.  ? gabapentin (NEURONTIN) 300 MG capsule Take 300 mg by mouth 2 (two) times daily.  ? metFORMIN (GLUCOPHAGE) 1000 MG tablet Take 1,000 mg by mouth 2 (two) times daily with a meal.  ? Multiple Vitamins-Minerals (MULTIVITAMIN GUMMIES ADULTS PO) Take 1 tablet by mouth daily.  ? ?Laboratory examination:  ? ?Recent Labs  ?  09/19/21 ?0140 09/20/21 ?0626 09/21/21 ?3532  ?NA 135 137 134*  ?K 3.9 3.4* 3.8  ?CL 103 103 101  ?CO2 20* 19* 21*  ?GLUCOSE 138* 122* 132*  ?BUN '15 14 10  '$ ?CREATININE 0.74 0.73 0.66  ?CALCIUM 9.2 8.6* 8.5*  ?GFRNONAA >60 >60 >60  ? ?CrCl cannot be calculated (Patient's most recent lab result is older than the maximum 21 days  allowed.).  ? ?  Latest Ref Rng & Units 09/21/2021  ?  5:51 AM 09/20/2021  ?  6:26 AM 09/19/2021  ?  1:40 AM  ?CMP  ?Glucose 70 - 99 mg/dL 132   122   138    ?BUN 8 - 23 mg/dL '10   14   15    '$ ?Creatinine 0.61 - 1.24 mg/dL 0.66   0.73   0.74    ?Sodium 135 - 145 mmol/L 134   137   135    ?Potassium 3.5 - 5.1 mmol/L 3.8   3.4   3.9    ?Chloride 98 - 111 mmol/L 101   103   103    ?CO2 22 - 32 mmol/L '21   19   20    '$ ?Calcium 8.9 - 10.3 mg/dL 8.5   8.6   9.2    ? ? ?  Latest Ref Rng & Units 09/21/2021  ?  5:51 AM 09/20/2021  ?  6:26 AM 09/19/2021  ?  5:43 AM  ?CBC  ?WBC 4.0 - 10.5 K/uL 12.0   11.0   11.7    ?Hemoglobin 13.0 - 17.0 g/dL 12.9   12.7   14.0    ?Hematocrit 39.0 - 52.0 % 37.0   36.5   39.6    ?Platelets 150 - 400 K/uL 259   249   242    ? ? ?Lipid Panel ?No results for input(s): CHOL, TRIG, LDLCALC, VLDL, HDL, CHOLHDL, LDLDIRECT in the last 8760 hours. ? ?HEMOGLOBIN A1C ?Lab Results  ?Component Value Date  ? HGBA1C 7.7 (H) 09/17/2021  ? MPG 174.29 09/17/2021  ? ?TSH ?Recent Labs  ?  09/17/21 ?0105  ?TSH 0.451  ? ?External labs:  ?Cholesterol, total 133.000 m 12/20/2020 ?HDL 33.000 mg 12/20/2020 ?LDL 82.000 mg 12/20/2020 ?Triglycerides 91.000 mg 12/20/2020 ?A1C 7.000 06/30/2021 ? ?Radiology:  ? ?CT chest and abdomen without contrast 09/16/2021: ?Cardiovascular: The cardiac size is normal. There is no pericardial ?effusion. There is patchy calcification in the LAD and right ?coronary arteries. There is aortic calcific atherosclerosis in the ?arch and distal descending segment. The aorta is normal in caliber ?and course with scattered calcification in the great vessels. The ?pulmonary arteries and veins are normal caliber. ?Opacities in the right upper and lower lobe on the left, multilobar pneumonia. ?Small hiatal hernia. ?Diverticulosis and fatty liver. ?Nonobstructing nephrolithiasis. ? ?Cardiac Studies:  ? ?PCV MYOCARDIAL PERFUSION WO LEXISCAN 10/15/2021 ?Abnormal ECG stress. The patient exercised for 6 minutes and 0  seconds of a Bruce protocol, achieving approximately 7.42 METs. Resting EKG demonstrated normal sinus rhythm. No ST-T wave abnormalities. Peak EKG revealed greater than 1 mm ST depression of the inferolate

## 2021-11-26 DIAGNOSIS — E1139 Type 2 diabetes mellitus with other diabetic ophthalmic complication: Secondary | ICD-10-CM | POA: Diagnosis not present

## 2021-11-26 DIAGNOSIS — E782 Mixed hyperlipidemia: Secondary | ICD-10-CM | POA: Diagnosis not present

## 2021-11-26 DIAGNOSIS — I10 Essential (primary) hypertension: Secondary | ICD-10-CM | POA: Diagnosis not present

## 2021-11-26 DIAGNOSIS — Z794 Long term (current) use of insulin: Secondary | ICD-10-CM | POA: Diagnosis not present

## 2021-12-03 ENCOUNTER — Inpatient Hospital Stay: Payer: HMO

## 2021-12-03 DIAGNOSIS — I480 Paroxysmal atrial fibrillation: Secondary | ICD-10-CM | POA: Diagnosis not present

## 2021-12-17 ENCOUNTER — Ambulatory Visit: Payer: HMO | Admitting: Cardiology

## 2021-12-17 ENCOUNTER — Ambulatory Visit: Payer: HMO | Admitting: Student

## 2021-12-29 DIAGNOSIS — I480 Paroxysmal atrial fibrillation: Secondary | ICD-10-CM | POA: Diagnosis not present

## 2021-12-31 DIAGNOSIS — I480 Paroxysmal atrial fibrillation: Secondary | ICD-10-CM | POA: Diagnosis not present

## 2022-01-01 NOTE — Progress Notes (Signed)
Primary Physician/Referring:  Ginger Organ., MD  Patient ID: Willie [name], male    DOB: 10-05-1955, 66 y.o.   MRN: [id number]  Chief Complaint  Patient presents with   PAF    6 WEEKS    HPI:    Willie [name]  is a 66 y.o. Patient admitted with UTI, ileus and community-acquired pneumonia and severe sepsis on 09/16/2021 to the hospital along with diabetic ketoacidosis.  Patient developed transient atrial fibrillation and converted to sinus rhythm within a day and was started on Eliquis and discharged home. Past medical history is significant for hypertension, hyperlipidemia, diabetes mellitus with peripheral neuropathy.   Patient was last seen in the office 11/05/2021 at which time given concern of palpitations ordered 2-week cardiac monitor which revealed brief atrial tachycardia episodes, PACs and PVCs.  Patient triggered events on the monitor correlated with normal sinus rhythm.  There was no evidence of atrial fibrillation.  Patient now presents for 6-week follow-up.  He reports only 1-2 episodes of palpitations since last office visit.  Overall is feeling well.  Continues to tolerate anticoagulation without bleeding diathesis. Denies chest pain, dyspnea, dizziness.   Past Medical History:  Diagnosis Date   Adenomatous polyp    Atherosclerosis of aorta (HCC)    Cataract    Diabetes mellitus without complication (HCC)    Hearing loss, right    Hyperlipidemia    Hypertension    MRSA infection    Past Surgical History:  Procedure Laterality Date   Cervical Lymph Node Skin Biopsy Left    COLONOSCOPY     CYST REMOVAL LEG     DG THUMB LEFT HAND     INCISE AND DRAIN ABCESS     thumb   POLYPECTOMY     Family History  Problem Relation Age of Onset   Diabetes Mother    Parkinson's disease Mother    Lung cancer Father    Heart attack Father    Breast cancer Sister    CAD Brother    Diabetes Maternal Grandmother    Lung cancer Paternal Grandfather    Colon cancer Neg Hx     Stomach cancer Neg Hx    Colon polyps Neg Hx    Esophageal cancer Neg Hx    Rectal cancer Neg Hx     Social History   Tobacco Use   Smoking status: Former    Packs/day: 1.00    Years: 15.00    Pack years: 15.00    Types: Cigarettes    Quit date: 1982    Years since quitting: 41.4   Smokeless tobacco: Never  Substance Use Topics   Alcohol use: Yes    Comment: occasional wine   Marital Status: Married  ROS  Review of Systems  Cardiovascular:  Positive for palpitations (improved, 1-2 epsiodes since last visit). Negative for chest pain, dyspnea on exertion and leg swelling.  Objective  Blood pressure (!) 115/53, pulse 64, temperature 98.2 F (36.8 C), temperature source Temporal, resp. rate 17, height '5\' 11"'$  (1.803 m), weight 186 lb 9.6 oz (84.6 kg), SpO2 95 %. Body mass index is 26.03 kg/m.     01/06/2022    8:55 AM 11/05/2021    2:39 PM 09/25/2021   10:05 AM  Vitals with BMI  Height '5\' 11"'$  '5\' 11"'$  '5\' 11"'$   Weight 186 lbs 10 oz 182 lbs 172 lbs 3 oz  BMI 26.04 62.2 29.79  Systolic 892 119 417  Diastolic 53 68 64  Pulse 64  78 62    Physical Exam Vitals reviewed.  Neck:     Vascular: No carotid bruit or JVD.  Cardiovascular:     Rate and Rhythm: Normal rate and regular rhythm.     Pulses: Intact distal pulses.     Heart sounds: Normal heart sounds. No murmur heard.   No gallop.  Pulmonary:     Effort: Pulmonary effort is normal.     Breath sounds: Normal breath sounds.  Musculoskeletal:     Right lower leg: No edema.     Left lower leg: No edema.   Allergies  No Known Allergies   Medications Prior to Visit:   Outpatient Medications Prior to Visit  Medication Sig Dispense Refill   apixaban (ELIQUIS) 5 MG TABS tablet Take 1 tablet (5 mg total) by mouth 2 (two) times daily. 120 tablet 0   aspirin 81 MG tablet Take 81 mg by mouth daily.     atorvastatin (LIPITOR) 40 MG tablet Take 1 tablet (40 mg total) by mouth daily. 90 tablet 3   DULoxetine (CYMBALTA) 60 MG  capsule Take 60 mg by mouth daily.     ezetimibe (ZETIA) 10 MG tablet Take 1 tablet (10 mg total) by mouth daily after supper. 90 tablet 3   gabapentin (NEURONTIN) 300 MG capsule Take 300 mg by mouth 2 (two) times daily.     metFORMIN (GLUCOPHAGE) 1000 MG tablet Take 1,000 mg by mouth 2 (two) times daily with a meal.     metoprolol tartrate (LOPRESSOR) 25 MG tablet Take 0.5 tablets (12.5 mg total) by mouth 2 (two) times daily. 90 tablet 3   Multiple Vitamins-Minerals (MULTIVITAMIN GUMMIES ADULTS PO) Take 1 tablet by mouth daily.     pantoprazole (PROTONIX) 40 MG tablet Take 1 tablet by mouth daily.     No facility-administered medications prior to visit.   Final Medications at End of Visit    Current Meds  Medication Sig   apixaban (ELIQUIS) 5 MG TABS tablet Take 1 tablet (5 mg total) by mouth 2 (two) times daily.   aspirin 81 MG tablet Take 81 mg by mouth daily.   atorvastatin (LIPITOR) 40 MG tablet Take 1 tablet (40 mg total) by mouth daily.   DULoxetine (CYMBALTA) 60 MG capsule Take 60 mg by mouth daily.   ezetimibe (ZETIA) 10 MG tablet Take 1 tablet (10 mg total) by mouth daily after supper.   gabapentin (NEURONTIN) 300 MG capsule Take 300 mg by mouth 2 (two) times daily.   metFORMIN (GLUCOPHAGE) 1000 MG tablet Take 1,000 mg by mouth 2 (two) times daily with a meal.   metoprolol tartrate (LOPRESSOR) 25 MG tablet Take 0.5 tablets (12.5 mg total) by mouth 2 (two) times daily.   Multiple Vitamins-Minerals (MULTIVITAMIN GUMMIES ADULTS PO) Take 1 tablet by mouth daily.   pantoprazole (PROTONIX) 40 MG tablet Take 1 tablet by mouth daily.   Laboratory examination:   Recent Labs    09/19/21 0140 09/20/21 0626 09/21/21 0551  NA 135 137 134*  K 3.9 3.4* 3.8  CL 103 103 101  CO2 20* 19* 21*  GLUCOSE 138* 122* 132*  BUN '15 14 10  '$ CREATININE 0.74 0.73 0.66  CALCIUM 9.2 8.6* 8.5*  GFRNONAA >60 >60 >60   CrCl cannot be calculated (Patient's most recent lab result is older than the  maximum 21 days allowed.).     Latest Ref Rng & Units 09/21/2021    5:51 AM 09/20/2021    6:26 AM 09/19/2021  1:40 AM  CMP  Glucose 70 - 99 mg/dL 132   122   138    BUN 8 - 23 mg/dL '10   14   15    '$ Creatinine 0.61 - 1.24 mg/dL 0.66   0.73   0.74    Sodium 135 - 145 mmol/L 134   137   135    Potassium 3.5 - 5.1 mmol/L 3.8   3.4   3.9    Chloride 98 - 111 mmol/L 101   103   103    CO2 22 - 32 mmol/L '21   19   20    '$ Calcium 8.9 - 10.3 mg/dL 8.5   8.6   9.2        Latest Ref Rng & Units 09/21/2021    5:51 AM 09/20/2021    6:26 AM 09/19/2021    5:43 AM  CBC  WBC 4.0 - 10.5 K/uL 12.0   11.0   11.7    Hemoglobin 13.0 - 17.0 g/dL 12.9   12.7   14.0    Hematocrit 39.0 - 52.0 % 37.0   36.5   39.6    Platelets 150 - 400 K/uL 259   249   242      Lipid Panel No results for input(s): CHOL, TRIG, LDLCALC, VLDL, HDL, CHOLHDL, LDLDIRECT in the last 8760 hours.  HEMOGLOBIN A1C Lab Results  Component Value Date   HGBA1C 7.7 (H) 09/17/2021   MPG 174.29 09/17/2021   TSH Recent Labs    09/17/21 0105  TSH 0.451   External labs:  Cholesterol, total 133.000 m 12/20/2020 HDL 33.000 mg 12/20/2020 LDL 82.000 mg 12/20/2020 Triglycerides 91.000 mg 12/20/2020 A1C 7.000 06/30/2021  Radiology:   CT chest and abdomen without contrast 09/16/2021: Cardiovascular: The cardiac size is normal. There is no pericardial effusion. There is patchy calcification in the LAD and right coronary arteries. There is aortic calcific atherosclerosis in the arch and distal descending segment. The aorta is normal in caliber and course with scattered calcification in the great vessels. The pulmonary arteries and veins are normal caliber. Opacities in the right upper and lower lobe on the left, multilobar pneumonia. Small hiatal hernia. Diverticulosis and fatty liver. Nonobstructing nephrolithiasis.  Cardiac Studies:  Zio Patch Extended out patient EKG monitoring 14 days starting 12/03/2021: Predominant rhythm is  normal sinus rhythm.  Brief atrial tachycardia episodes, longest 6 beats.  Isolated PACs, atrial couplets.  Isolated ventricular ectopics and rare ventricular couplets.  Occasional ventricular bigeminy.  22 patient triggered events, normal sinus rhythm.  PCV MYOCARDIAL PERFUSION WO LEXISCAN 10/15/2021 Abnormal ECG stress. The patient exercised for 6 minutes and 0 seconds of a Bruce protocol, achieving approximately 7.42 METs. Resting EKG demonstrated normal sinus rhythm. No ST-T wave abnormalities. Peak EKG revealed greater than 1 mm ST depression of the inferolateral leads. Recurrence of ST changes in recovery after 2 minutes.  No chest pain. Stress terminated due to dyspnea and THR achieved. The blood pressure response was normal. Mild diaphragmatic attenuation noted. Myocardial perfusion is normal. Overall LV systolic function is normal without regional wall motion abnormalities. Stress LV EF: 55%. No previous exam available for comparison. Low risk.  Echocardiogram 09/20/2021:  1. Left ventricular ejection fraction, by estimation, is 55 to 60%. The left ventricle has normal function. The left ventricle has no regional wall motion abnormalities. Left ventricular diastolic parameters are indeterminate.  2. Right ventricular systolic function is normal. The right ventricular size is normal. Tricuspid regurgitation signal is  inadequate for assessing PA pressure.  3. The mitral valve is normal in structure. Trivial mitral valve regurgitation. No evidence of mitral stenosis.  4. The aortic valve is tricuspid. Aortic valve regurgitation is not visualized. Aortic valve sclerosis/calcification is present, without any evidence of aortic stenosis.  5. The inferior vena cava is normal in size with greater than 50% respiratory variability, suggesting right atrial pressure of 3 mmHg.  EKG:  01/06/2022: Sinus rhythm at a rate of 60 bpm.  Normal axis.  No evidence of ischemia or underlying injury pattern.  Normal  EKG.  Compared EKG 09/25/2021, no significant change.  EKG 09/16/2021: Sinus tachycardia at a rate of 1 112 bpm.  EKG 09/17/2021: Atrial fibrillation with rapid ventricular response at rate of 139 bpm, nonspecific T abnormality.  Assessment     ICD-10-CM   1. Paroxysmal atrial fibrillation (HCC)  I48.0 EKG 12-Lead    2. Primary hypertension  I10       CHA2DS2-VASc Score is 4.  Yearly risk of stroke: 4.8% (A, HTN, DM, Atherosclerosis).  Score of 1=0.6; 2=2.2; 3=3.2; 4=4.8; 5=7.2; 6=9.8; 7=>9.8) -(CHF; HTN; vasc disease DM,  Male = 1; Age <65 =0; 65-74 = 1,  >75 =2; stroke/embolism= 2).    There are no discontinued medications.   No orders of the defined types were placed in this encounter.  Orders Placed This Encounter  Procedures   EKG 12-Lead   Recommendations:   Taten Merrow is a 66 y.o.  Patient admitted with UTI, ileus and community-acquired pneumonia and severe sepsis on 09/16/2021 to the hospital along with diabetic ketoacidosis.  Patient developed transient atrial fibrillation and converted to sinus rhythm within a day and was started on Eliquis and discharged home. Past medical history is significant for hypertension, hyperlipidemia, diabetes mellitus with peripheral neuropathy.   Patient was last seen in the office 11/05/2021 at which time given concern of palpitations ordered 2-week cardiac monitor which revealed brief atrial tachycardia episodes, PACs and PVCs.  Patient triggered events on the monitor correlated with normal sinus rhythm.  There was no evidence of atrial fibrillation.  Patient now presents for 6-week follow-up.  Palpitations have significantly improved since last office visit.  Reviewed and discussed results of cardiac monitor, details above.  Cardiac monitor revealed no recurrence of atrial fibrillation/flutter.  Therefore again discussed with patient risks versus benefits of long-term oral anticoagulation in the setting of CHA2DS2-VASc score of 4.  Suspect  patient's A-fib related to severe sepsis and dehydration.  However after extensive discussion shared decision with patient and his wife was to continue oral anticoagulation.  We will continue Eliquis.  No changes are made to medications at this time.  Follow-up in 6 months, sooner if needed.   Alethia Berthold, PA-C 01/06/2022, 9:46 AM Office: [phone]

## 2022-01-02 ENCOUNTER — Ambulatory Visit: Payer: HMO | Admitting: Student

## 2022-01-06 ENCOUNTER — Ambulatory Visit: Payer: HMO | Admitting: Student

## 2022-01-06 ENCOUNTER — Encounter: Payer: Self-pay | Admitting: Student

## 2022-01-06 VITALS — BP 115/53 | HR 64 | Temp 98.2°F | Resp 17 | Ht 71.0 in | Wt 186.6 lb

## 2022-01-06 DIAGNOSIS — I480 Paroxysmal atrial fibrillation: Secondary | ICD-10-CM

## 2022-01-06 DIAGNOSIS — I10 Essential (primary) hypertension: Secondary | ICD-10-CM

## 2022-01-08 DIAGNOSIS — E1139 Type 2 diabetes mellitus with other diabetic ophthalmic complication: Secondary | ICD-10-CM | POA: Diagnosis not present

## 2022-01-08 DIAGNOSIS — I10 Essential (primary) hypertension: Secondary | ICD-10-CM | POA: Diagnosis not present

## 2022-01-08 DIAGNOSIS — Z125 Encounter for screening for malignant neoplasm of prostate: Secondary | ICD-10-CM | POA: Diagnosis not present

## 2022-01-08 DIAGNOSIS — E782 Mixed hyperlipidemia: Secondary | ICD-10-CM | POA: Diagnosis not present

## 2022-01-14 DIAGNOSIS — Z0000 Encounter for general adult medical examination without abnormal findings: Secondary | ICD-10-CM | POA: Diagnosis not present

## 2022-01-14 DIAGNOSIS — Z1331 Encounter for screening for depression: Secondary | ICD-10-CM | POA: Diagnosis not present

## 2022-01-14 DIAGNOSIS — Z794 Long term (current) use of insulin: Secondary | ICD-10-CM | POA: Diagnosis not present

## 2022-01-14 DIAGNOSIS — Z23 Encounter for immunization: Secondary | ICD-10-CM | POA: Diagnosis not present

## 2022-01-14 DIAGNOSIS — H93A2 Pulsatile tinnitus, left ear: Secondary | ICD-10-CM | POA: Diagnosis not present

## 2022-01-14 DIAGNOSIS — Z8639 Personal history of other endocrine, nutritional and metabolic disease: Secondary | ICD-10-CM | POA: Diagnosis not present

## 2022-01-14 DIAGNOSIS — E1142 Type 2 diabetes mellitus with diabetic polyneuropathy: Secondary | ICD-10-CM | POA: Diagnosis not present

## 2022-01-14 DIAGNOSIS — I700 Atherosclerosis of aorta: Secondary | ICD-10-CM | POA: Diagnosis not present

## 2022-01-14 DIAGNOSIS — I10 Essential (primary) hypertension: Secondary | ICD-10-CM | POA: Diagnosis not present

## 2022-01-14 DIAGNOSIS — R1310 Dysphagia, unspecified: Secondary | ICD-10-CM | POA: Diagnosis not present

## 2022-01-14 DIAGNOSIS — E782 Mixed hyperlipidemia: Secondary | ICD-10-CM | POA: Diagnosis not present

## 2022-01-14 DIAGNOSIS — E1139 Type 2 diabetes mellitus with other diabetic ophthalmic complication: Secondary | ICD-10-CM | POA: Diagnosis not present

## 2022-01-14 DIAGNOSIS — D6869 Other thrombophilia: Secondary | ICD-10-CM | POA: Diagnosis not present

## 2022-01-14 DIAGNOSIS — Z1339 Encounter for screening examination for other mental health and behavioral disorders: Secondary | ICD-10-CM | POA: Diagnosis not present

## 2022-01-14 DIAGNOSIS — I480 Paroxysmal atrial fibrillation: Secondary | ICD-10-CM | POA: Diagnosis not present

## 2022-07-09 ENCOUNTER — Ambulatory Visit: Payer: HMO | Admitting: Student

## 2022-07-09 ENCOUNTER — Ambulatory Visit: Payer: HMO

## 2022-07-09 VITALS — BP 121/72 | HR 64 | Ht 71.0 in | Wt 195.0 lb

## 2022-07-09 DIAGNOSIS — I480 Paroxysmal atrial fibrillation: Secondary | ICD-10-CM

## 2022-07-09 DIAGNOSIS — E7800 Pure hypercholesterolemia, unspecified: Secondary | ICD-10-CM

## 2022-07-09 DIAGNOSIS — I10 Essential (primary) hypertension: Secondary | ICD-10-CM

## 2022-07-09 NOTE — Progress Notes (Signed)
Primary Physician/Referring:  Ginger Organ., MD  Patient ID: Willie [name], male    DOB: 04/22/1956, 66 y.o.   MRN: [id number]  Chief Complaint  Patient presents with   Follow-up   Hypertension   Hyperlipidemia   Atrial Fibrillation    HPI:    Willie [name]  is a 66 y.o. Patient admitted with UTI, ileus and community-acquired pneumonia and severe sepsis on 09/16/2021 to the hospital along with diabetic ketoacidosis.  Patient developed transient atrial fibrillation and converted to sinus rhythm within a day and was started on Eliquis and discharged home. Past medical history is significant for hypertension, hyperlipidemia, diabetes mellitus with peripheral neuropathy.   Patient was last seen in the office 01/06/2022 for follow-up after 2 week cardiac event monitor which revealed brief atrial tachycardia episodes, PACs and PVCs. There was no evidence of atrial fibrillation. However, shared decision was made to continue Eliquis. However, he decided to stop taking Eliquis about 1 week after last visit.  He now presents for 68-monthfollow-up.  Overall is feeling well.  He has had a few episodes of palpitations since last visit that usually occur when laying down at night and last a few seconds and resolve on their own. Denies chest pain, dyspnea, leg edema, dizziness.   Past Medical History:  Diagnosis Date   Adenomatous polyp    Atherosclerosis of aorta (HCC)    Cataract    Diabetes mellitus without complication (HCC)    Hearing loss, right    Hyperlipidemia    Hypertension    MRSA infection    Past Surgical History:  Procedure Laterality Date   Cervical Lymph Node Skin Biopsy Left    COLONOSCOPY     CYST REMOVAL LEG     DG THUMB LEFT HAND     INCISE AND DRAIN ABCESS     thumb   POLYPECTOMY     Family History  Problem Relation Age of Onset   Diabetes Mother    Parkinson's disease Mother    Lung cancer Father    Heart attack Father    Breast cancer Sister    CAD Brother     Diabetes Maternal Grandmother    Lung cancer Paternal Grandfather    Colon cancer Neg Hx    Stomach cancer Neg Hx    Colon polyps Neg Hx    Esophageal cancer Neg Hx    Rectal cancer Neg Hx     Social History   Tobacco Use   Smoking status: Former    Packs/day: 1.00    Years: 15.00    Total pack years: 15.00    Types: Cigarettes    Quit date: 1980    Years since quitting: 43.9   Smokeless tobacco: Never  Substance Use Topics   Alcohol use: Yes    Comment: occasional wine   Marital Status: Married  ROS  Review of Systems  Cardiovascular:  Positive for palpitations (stable). Negative for chest pain, dyspnea on exertion and leg swelling.   Objective  Blood pressure 121/72, pulse 64, height _0  (1.803 m), weight 195 lb (88.5 kg), SpO2 96 %. Body mass index is 27.2 kg/m.     07/09/2022    9:32 AM 01/06/2022    8:55 AM 11/05/2021    2:39 PM  Vitals with BMI  Height _1  _2  _3   Weight 195 lbs 186 lbs 10 oz 182 lbs  BMI 27.21 288.50227.7 Systolic [id number] Diastolic 72 53  68  Pulse 64 64 78    Physical Exam Vitals reviewed.  Neck:     Vascular: No carotid bruit or JVD.  Cardiovascular:     Rate and Rhythm: Normal rate and regular rhythm.     Pulses: Intact distal pulses.          Carotid pulses are 2+ on the right side and 2+ on the left side.      Radial pulses are 2+ on the right side and 2+ on the left side.       Dorsalis pedis pulses are 2+ on the right side and 2+ on the left side.     Heart sounds: Normal heart sounds. No murmur heard.    No gallop.  Pulmonary:     Effort: Pulmonary effort is normal.     Breath sounds: Normal breath sounds.  Musculoskeletal:     Right lower leg: No edema.     Left lower leg: No edema.    Allergies   Allergies  Allergen Reactions   Empagliflozin     Other Reaction(s): DKA     Medications Prior to Visit:   Outpatient Medications Prior to Visit  Medication Sig Dispense Refill   aspirin 81 MG  tablet Take 81 mg by mouth daily.     atorvastatin (LIPITOR) 40 MG tablet Take 1 tablet (40 mg total) by mouth daily. 90 tablet 3   DULoxetine (CYMBALTA) 60 MG capsule Take 60 mg by mouth daily.     ezetimibe (ZETIA) 10 MG tablet Take 1 tablet (10 mg total) by mouth daily after supper. 90 tablet 3   gabapentin (NEURONTIN) 300 MG capsule Take 300 mg by mouth 2 (two) times daily.     metFORMIN (GLUCOPHAGE) 1000 MG tablet Take 1,000 mg by mouth 2 (two) times daily with a meal.     metoprolol tartrate (LOPRESSOR) 25 MG tablet Take 0.5 tablets (12.5 mg total) by mouth 2 (two) times daily. 90 tablet 3   pantoprazole (PROTONIX) 40 MG tablet Take 1 tablet by mouth daily.     apixaban (ELIQUIS) 5 MG TABS tablet Take 1 tablet (5 mg total) by mouth 2 (two) times daily. 120 tablet 0   Multiple Vitamins-Minerals (MULTIVITAMIN GUMMIES ADULTS PO) Take 1 tablet by mouth daily. (Patient not taking: Reported on 07/09/2022)     No facility-administered medications prior to visit.   Final Medications at End of Visit    Current Meds  Medication Sig   aspirin 81 MG tablet Take 81 mg by mouth daily.   atorvastatin (LIPITOR) 40 MG tablet Take 1 tablet (40 mg total) by mouth daily.   DULoxetine (CYMBALTA) 60 MG capsule Take 60 mg by mouth daily.   ezetimibe (ZETIA) 10 MG tablet Take 1 tablet (10 mg total) by mouth daily after supper.   gabapentin (NEURONTIN) 300 MG capsule Take 300 mg by mouth 2 (two) times daily.   metFORMIN (GLUCOPHAGE) 1000 MG tablet Take 1,000 mg by mouth 2 (two) times daily with a meal.   metoprolol tartrate (LOPRESSOR) 25 MG tablet Take 0.5 tablets (12.5 mg total) by mouth 2 (two) times daily.   pantoprazole (PROTONIX) 40 MG tablet Take 1 tablet by mouth daily.   Laboratory examination:   Recent Labs    09/19/21 0140 09/20/21 0626 09/21/21 0551  NA 135 137 134*  K 3.9 3.4* 3.8  CL 103 103 101  CO2 20* 19* 21*  GLUCOSE 138* 122* 132*  BUN _0 CREATININE 0.74  0.73 0.66   CALCIUM 9.2 8.6* 8.5*  GFRNONAA >60 >60 >60   CrCl cannot be calculated (Patient's most recent lab result is older than the maximum 21 days allowed.).     Latest Ref Rng & Units 09/21/2021    5:51 AM 09/20/2021    6:26 AM 09/19/2021    1:40 AM  CMP  Glucose 70 - 99 mg/dL 132  122  138   BUN 8 - 23 mg/dL _0 Creatinine 0.61 - 1.24 mg/dL 0.66  0.73  0.74   Sodium 135 - 145 mmol/L 134  137  135   Potassium 3.5 - 5.1 mmol/L 3.8  3.4  3.9   Chloride 98 - 111 mmol/L 101  103  103   CO2 22 - 32 mmol/L _1 Calcium 8.9 - 10.3 mg/dL 8.5  8.6  9.2       Latest Ref Rng & Units 09/21/2021    5:51 AM 09/20/2021    6:26 AM 09/19/2021    5:43 AM  CBC  WBC 4.0 - 10.5 K/uL 12.0  11.0  11.7   Hemoglobin 13.0 - 17.0 g/dL 12.9  12.7  14.0   Hematocrit 39.0 - 52.0 % 37.0  36.5  39.6   Platelets 150 - 400 K/uL 259  249  242    HEMOGLOBIN A1C Lab Results  Component Value Date   HGBA1C 7.7 (H) 09/17/2021   MPG 174.29 09/17/2021   TSH Recent Labs    09/17/21 0105  TSH 0.451   External labs:  A1C 8.400 04/22/2022 BUN 12.000 01/08/2022 Chlamydia N/D Cholesterol 98.000 01/08/2022 Creatinine 0.660 09/21/2021 eGFR 96.100 01/08/2022 Glucose, Rand UR3 05/25/2022 HDL 32.000 01/08/2022 Hemoglobin 12.900 09/21/2021 INR 1.200 09/16/2021 LDL 54.000 01/08/2022 MicroAlbumin 5.000 12/08/2019 Potassium 3.900 01/08/2022 Triglycerides 59.000 01/08/2022 TSH 2.270 01/08/2022   Radiology:   CT chest and abdomen without contrast 09/16/2021: Cardiovascular: The cardiac size is normal. There is no pericardial effusion. There is patchy calcification in the LAD and right coronary arteries. There is aortic calcific atherosclerosis in the arch and distal descending segment. The aorta is normal in caliber and course with scattered calcification in the great vessels. The pulmonary arteries and veins are normal caliber. Opacities in the right upper and lower lobe on the left, multilobar pneumonia. Small hiatal  hernia. Diverticulosis and fatty liver. Nonobstructing nephrolithiasis.  Cardiac Studies:  Zio Patch Extended out patient EKG monitoring 14 days starting 12/03/2021: Predominant rhythm is normal sinus rhythm.  Brief atrial tachycardia episodes, longest 6 beats.  Isolated PACs, atrial couplets.  Isolated ventricular ectopics and rare ventricular couplets.  Occasional ventricular bigeminy.  22 patient triggered events, normal sinus rhythm.  PCV MYOCARDIAL PERFUSION WO LEXISCAN 10/15/2021 Abnormal ECG stress. The patient exercised for 6 minutes and 0 seconds of a Bruce protocol, achieving approximately 7.42 METs. Resting EKG demonstrated normal sinus rhythm. No ST-T wave abnormalities. Peak EKG revealed greater than 1 mm ST depression of the inferolateral leads. Recurrence of ST changes in recovery after 2 minutes.  No chest pain. Stress terminated due to dyspnea and THR achieved. The blood pressure response was normal. Mild diaphragmatic attenuation noted. Myocardial perfusion is normal. Overall LV systolic function is normal without regional wall motion abnormalities. Stress LV EF: 55%. No previous exam available for comparison. Low risk.  Echocardiogram 09/20/2021:  1. Left ventricular ejection fraction, by estimation, is 55 to 60%. The left ventricle has normal function. The left ventricle has no regional  wall motion abnormalities. Left ventricular diastolic parameters are indeterminate.  2. Right ventricular systolic function is normal. The right ventricular size is normal. Tricuspid regurgitation signal is inadequate for assessing PA pressure.  3. The mitral valve is normal in structure. Trivial mitral valve regurgitation. No evidence of mitral stenosis.  4. The aortic valve is tricuspid. Aortic valve regurgitation is not visualized. Aortic valve sclerosis/calcification is present, without any evidence of aortic stenosis.  5. The inferior vena cava is normal in size with greater than 50%  respiratory variability, suggesting right atrial pressure of 3 mmHg.  EKG:   EKG 07/09/2022: Sinus bradycardia at rate of 58 bpm.  Normal axis.  Nonspecific T wave abnormality.  Compared to previous EKG on 01/06/2022, no significant change.  Assessment     ICD-10-CM   1. Paroxysmal atrial fibrillation (HCC)  I48.0 EKG 12-Lead    2. Primary hypertension  I10     3. Pure hypercholesterolemia  E78.00       CHA2DS2-VASc Score is 4.  Yearly risk of stroke: 4.8% (A, HTN, DM, Atherosclerosis).  Score of 1=0.6; 2=2.2; 3=3.2; 4=4.8; 5=7.2; 6=9.8; 7=>9.8) -(CHF; HTN; vasc disease DM,  Male = 1; Age <65 =0; 65-74 = 1,  >75 =2; stroke/embolism= 2).    Medications Discontinued During This Encounter  Medication Reason   apixaban (ELIQUIS) 5 MG TABS tablet Discontinued by provider   Multiple Vitamins-Minerals (MULTIVITAMIN GUMMIES ADULTS PO) Patient Preference     No orders of the defined types were placed in this encounter.  Orders Placed This Encounter  Procedures   EKG 12-Lead   Recommendations:   Willie [name] is a 66 y.o.  Patient admitted with UTI, ileus and community-acquired pneumonia and severe sepsis on 09/16/2021 to the hospital along with diabetic ketoacidosis.  Patient developed transient atrial fibrillation and converted to sinus rhythm within a day and was started on Eliquis and discharged home. Past medical history is significant for hypertension, hyperlipidemia, diabetes mellitus with peripheral neuropathy.   Paroxysmal atrial fibrillation (HCC) He is maintaining sinus rhythm. Continues on metoprolol 12.5 mg twice daily He has stopped taking Eliquis since last visit, this decision was made by him due to frequent bruising.  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 He continues on aspirin 81 mg daily.  Primary hypertension Blood pressure is under excellent control. Discussed the importance of exercise at  least 30 minutes/day 5 times a week.  Pure hypercholesterolemia Independently reviewed and interpreted external labs lipids are under good control. He continues on atorvastatin 40 mg daily and Zetia 10 mg daily without myalgias.  Overall, he is stable from a cardiovascular standpoint.  We will follow-up in 1 year or sooner if needed.   Ernst Spell, [place] Office: [phone] Pager: [phone]

## 2022-09-08 DIAGNOSIS — I10 Essential (primary) hypertension: Secondary | ICD-10-CM | POA: Diagnosis not present

## 2022-09-08 DIAGNOSIS — E1139 Type 2 diabetes mellitus with other diabetic ophthalmic complication: Secondary | ICD-10-CM | POA: Diagnosis not present

## 2022-09-08 DIAGNOSIS — E782 Mixed hyperlipidemia: Secondary | ICD-10-CM | POA: Diagnosis not present

## 2022-09-08 DIAGNOSIS — Z794 Long term (current) use of insulin: Secondary | ICD-10-CM | POA: Diagnosis not present

## 2022-09-28 DIAGNOSIS — E782 Mixed hyperlipidemia: Secondary | ICD-10-CM | POA: Diagnosis not present

## 2022-09-28 DIAGNOSIS — Z86010 Personal history of colonic polyps: Secondary | ICD-10-CM | POA: Diagnosis not present

## 2022-09-28 DIAGNOSIS — R400 Somnolence: Secondary | ICD-10-CM | POA: Diagnosis not present

## 2022-09-28 DIAGNOSIS — Z8639 Personal history of other endocrine, nutritional and metabolic disease: Secondary | ICD-10-CM | POA: Diagnosis not present

## 2022-09-28 DIAGNOSIS — M19041 Primary osteoarthritis, right hand: Secondary | ICD-10-CM | POA: Diagnosis not present

## 2022-09-28 DIAGNOSIS — I10 Essential (primary) hypertension: Secondary | ICD-10-CM | POA: Diagnosis not present

## 2022-09-28 DIAGNOSIS — M19042 Primary osteoarthritis, left hand: Secondary | ICD-10-CM | POA: Diagnosis not present

## 2022-09-28 DIAGNOSIS — E1139 Type 2 diabetes mellitus with other diabetic ophthalmic complication: Secondary | ICD-10-CM | POA: Diagnosis not present

## 2022-09-28 DIAGNOSIS — G3184 Mild cognitive impairment, so stated: Secondary | ICD-10-CM | POA: Diagnosis not present

## 2022-09-28 DIAGNOSIS — H93A2 Pulsatile tinnitus, left ear: Secondary | ICD-10-CM | POA: Diagnosis not present

## 2022-09-28 DIAGNOSIS — Z794 Long term (current) use of insulin: Secondary | ICD-10-CM | POA: Diagnosis not present

## 2022-09-28 DIAGNOSIS — I700 Atherosclerosis of aorta: Secondary | ICD-10-CM | POA: Diagnosis not present

## 2022-10-27 DIAGNOSIS — D2261 Melanocytic nevi of right upper limb, including shoulder: Secondary | ICD-10-CM | POA: Diagnosis not present

## 2022-10-27 DIAGNOSIS — L821 Other seborrheic keratosis: Secondary | ICD-10-CM | POA: Diagnosis not present

## 2022-10-27 DIAGNOSIS — D2262 Melanocytic nevi of left upper limb, including shoulder: Secondary | ICD-10-CM | POA: Diagnosis not present

## 2022-10-27 DIAGNOSIS — D225 Melanocytic nevi of trunk: Secondary | ICD-10-CM | POA: Diagnosis not present

## 2022-10-27 DIAGNOSIS — D692 Other nonthrombocytopenic purpura: Secondary | ICD-10-CM | POA: Diagnosis not present

## 2022-10-27 DIAGNOSIS — D1801 Hemangioma of skin and subcutaneous tissue: Secondary | ICD-10-CM | POA: Diagnosis not present

## 2022-10-27 DIAGNOSIS — D2371 Other benign neoplasm of skin of right lower limb, including hip: Secondary | ICD-10-CM | POA: Diagnosis not present

## 2022-11-09 ENCOUNTER — Other Ambulatory Visit: Payer: Self-pay | Admitting: Cardiology

## 2022-11-09 DIAGNOSIS — E7800 Pure hypercholesterolemia, unspecified: Secondary | ICD-10-CM

## 2022-11-16 ENCOUNTER — Encounter: Payer: Self-pay | Admitting: Neurology

## 2022-11-16 ENCOUNTER — Telehealth: Payer: Self-pay | Admitting: Neurology

## 2022-11-16 ENCOUNTER — Ambulatory Visit: Payer: HMO | Admitting: Neurology

## 2022-11-16 VITALS — BP 128/70 | HR 70 | Ht 72.0 in | Wt 192.0 lb

## 2022-11-16 DIAGNOSIS — R1312 Dysphagia, oropharyngeal phase: Secondary | ICD-10-CM

## 2022-11-16 DIAGNOSIS — G309 Alzheimer's disease, unspecified: Secondary | ICD-10-CM | POA: Diagnosis not present

## 2022-11-16 DIAGNOSIS — G4719 Other hypersomnia: Secondary | ICD-10-CM | POA: Diagnosis not present

## 2022-11-16 DIAGNOSIS — E1142 Type 2 diabetes mellitus with diabetic polyneuropathy: Secondary | ICD-10-CM

## 2022-11-16 DIAGNOSIS — R6520 Severe sepsis without septic shock: Secondary | ICD-10-CM | POA: Diagnosis not present

## 2022-11-16 DIAGNOSIS — H9313 Tinnitus, bilateral: Secondary | ICD-10-CM | POA: Diagnosis not present

## 2022-11-16 DIAGNOSIS — R259 Unspecified abnormal involuntary movements: Secondary | ICD-10-CM | POA: Insufficient documentation

## 2022-11-16 DIAGNOSIS — R490 Dysphonia: Secondary | ICD-10-CM

## 2022-11-16 DIAGNOSIS — I4891 Unspecified atrial fibrillation: Secondary | ICD-10-CM

## 2022-11-16 DIAGNOSIS — A419 Sepsis, unspecified organism: Secondary | ICD-10-CM | POA: Diagnosis not present

## 2022-11-16 MED ORDER — CARBIDOPA-LEVODOPA 25-100 MG PO TABS
1.0000 | ORAL_TABLET | Freq: Three times a day (TID) | ORAL | Status: DC
Start: 2022-11-17 — End: 2023-04-08

## 2022-11-16 NOTE — Progress Notes (Signed)
SLEEP MEDICINE CLINIC    Provider:  Melvyn Novas, MD  Primary Care Physician:  Cleatis Polka., MD [address] [place] [address]     Referring Provider: Cleatis Polka., Md [address] [place],  [place] [address]          Chief Complaint according to patient   Patient presents with:     New Patient (Initial Visit)           HISTORY OF PRESENT ILLNESS:  Willie [name] is a 67 y.o. male patient who is seen upon referral on 11/16/2022 from Dr Clelia Croft  for a sleep evaluation in the contact of memory loss.   Chief concern according to patient :  I sleep well, but I have memory problems, and atrial fib. My wife handles a lot of financial affairs, and I help. "   I have the pleasure of seeing Willie [name] 11/16/22 a right-handed male with a memory concern, and referred for sleepiness , possible sleep disorder.      Sleep relevant medical history: 09-2022 , hospitalized for 6 days , dx with atrial fibrillation, Tonsillectomy in child hood.  He is hard of hearing. He gained weight.   Family medical /sleep history: no other family member on CPAP with OSA.    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 The patient used to work in shifts( Chief Technology Officer)   - and lives in a household with spouse and step son.  Pets are not present. Tobacco use: smoked 4 year - 45 years ago-.   ETOH use ; rare ,  Caffeine intake in form of Coffee( yes,  1-2 cups) Soda( 2-3 / day) Tea ( only when eating out) or energy drinks Exercise in form of work related - .   Hobbies :on my feet       Sleep habits are as follows: The patient's dinner time is between 6-7 PM. The patient goes to bed at 11 PM and continues to sleep for 7 hours, wakes for one bathroom break.   The preferred sleep position is left laterally, with the support of one pillow. Bed is flat.  Dreams are reportedly in  frequent.  The patient wakes up with an alarm at 7.30. 20 minutes snoozing, then 8  AM is the usual rise time. He reports not feeling refreshed or restored in AM, with symptoms such as dry mouth.  Naps are taken frequently, when ever not active- lasting from 10 to 30 minutes and are not refreshing .   Review of Systems: Out of a complete 14 system review, the patient complains of only the following symptoms, and all other reviewed systems are negative.:    Resting Tremor and dysphonia since admission to hospital , sepsis 09-2022.    Memory impairment  Dysphagia  Dysphonia.   Fatigue, sleepiness , snoring, a year ago there were RLS, not now.  unfragmented sleep, Insomnia,leg cramping,    How likely are you to doze in the following situations: 0 = not likely, 1 = slight chance, 2 = moderate chance, 3 = high chance   Sitting and Reading? Watching Television? Sitting inactive in a public place (theater or meeting)? As a passenger in a car for an hour without a break? Lying down in the afternoon when circumstances permit? Sitting and talking to someone? Sitting quietly after  lunch without alcohol? In a car, while stopped for a few minutes in traffic?   Total = 12/ 24 points   FSS endorsed at 44/ 63 points.     Social History   Socioeconomic History   Marital status: Married    Spouse name: Zerek Mccamish   Number of children: 5   Years of education: Not on file   Highest education level: Not on file  Occupational History   Not on file  Tobacco Use   Smoking status: Former    Packs/day: 1.00    Years: 15.00    Additional pack years: 0.00    Total pack years: 15.00    Types: Cigarettes    Quit date: 84    Years since quitting: 44.2   Smokeless tobacco: Never  Vaping Use   Vaping Use: Never used  Substance and Sexual Activity   Alcohol use: Yes    Comment: occasional wine   Drug use: No   Sexual activity: Not on file  Other Topics Concern   Not on file   Social History Narrative   Not on file   Social Determinants of Health   Financial Resource Strain: Not on file  Food Insecurity: No Food Insecurity (12/14/2019)   Hunger Vital Sign    Worried About Running Out of Food in the Last Year: Never true    Ran Out of Food in the Last Year: Never true  Transportation Needs: Not on file  Physical Activity: Not on file  Stress: Not on file  Social Connections: Not on file    Family History  Problem Relation Age of Onset   Diabetes Mother    Parkinson's disease Mother    Lung cancer Father    Heart attack Father    Breast cancer Sister    CAD Brother    Diabetes Maternal Grandmother    Lung cancer Paternal Grandfather    Colon cancer Neg Hx    Stomach cancer Neg Hx    Colon polyps Neg Hx    Esophageal cancer Neg Hx    Rectal cancer Neg Hx     Past Medical History:  Diagnosis Date   Adenomatous polyp    Atherosclerosis of aorta    Cataract    Diabetes mellitus without complication    Hearing loss, right    Hyperlipidemia    Hypertension    MRSA infection     Past Surgical History:  Procedure Laterality Date   Cervical Lymph Node Skin Biopsy Left    COLONOSCOPY     CYST REMOVAL LEG     DG THUMB LEFT HAND     INCISE AND DRAIN ABCESS     thumb   POLYPECTOMY       Current Outpatient Medications on File Prior to Visit  Medication Sig Dispense Refill   aspirin 81 MG tablet Take 81 mg by mouth daily.     atorvastatin (LIPITOR) 40 MG tablet Take 1 tablet (40 mg total) by mouth daily. 90 tablet 3   DULoxetine (CYMBALTA) 60 MG capsule Take 60 mg by mouth daily.     ezetimibe (ZETIA) 10 MG tablet TAKE 1 TABLET (10 MG TOTAL) BY MOUTH DAILY AFTER SUPPER. 90 tablet 3   gabapentin (NEURONTIN) 300 MG capsule Take 300 mg by mouth 2 (two) times daily.     insulin degludec (TRESIBA FLEXTOUCH) 200 UNIT/ML FlexTouch Pen Inject 48 Units into the skin daily.     metFORMIN (GLUCOPHAGE) 1000 MG tablet Take 1,000 mg  by mouth 2 (two) times  daily with a meal.     pantoprazole (PROTONIX) 40 MG tablet Take 1 tablet by mouth daily.     Semaglutide, 1 MG/DOSE, (OZEMPIC, 1 MG/DOSE,) 4 MG/3ML SOPN Inject 1 mg as directed once a week.     telmisartan (MICARDIS) 40 MG tablet Take 40 mg by mouth daily.     metoprolol tartrate (LOPRESSOR) 25 MG tablet Take 0.5 tablets (12.5 mg total) by mouth 2 (two) times daily. 90 tablet 3   No current facility-administered medications on file prior to visit.    Allergies  Allergen Reactions   Empagliflozin     Other Reaction(s): DKA     DIAGNOSTIC DATA (LABS, IMAGING, TESTING) - I reviewed patient records, labs, notes, testing and imaging myself where available.  Lab Results  Component Value Date   WBC 12.0 (H) 09/21/2021   HGB 12.9 (L) 09/21/2021   HCT 37.0 (L) 09/21/2021   MCV 87.5 09/21/2021   PLT 259 09/21/2021      Component Value Date/Time   NA 134 (L) 09/21/2021 0551   K 3.8 09/21/2021 0551   CL 101 09/21/2021 0551   CO2 21 (L) 09/21/2021 0551   GLUCOSE 132 (H) 09/21/2021 0551   BUN 10 09/21/2021 0551   CREATININE 0.66 09/21/2021 0551   CALCIUM 8.5 (L) 09/21/2021 0551   PROT 6.7 09/17/2021 0105   ALBUMIN 2.9 (L) 09/17/2021 0105   AST 14 (L) 09/17/2021 0105   ALT 13 09/17/2021 0105   ALKPHOS 95 09/17/2021 0105   BILITOT 1.6 (H) 09/17/2021 0105   GFRNONAA >60 09/21/2021 0551   No results found for: "CHOL", "HDL", "LDLCALC", "LDLDIRECT", "TRIG", "CHOLHDL" Lab Results  Component Value Date   HGBA1C 7.7 (H) 09/17/2021   No results found for: "VITAMINB12" Lab Results  Component Value Date   TSH 0.451 09/17/2021    PHYSICAL EXAM:  Today's Vitals   11/16/22 1105  BP: 128/70  Pulse: 70  Weight: 192 lb (87.1 kg)  Height: 6' (1.829 m)   Body mass index is 26.04 kg/m.   Wt Readings from Last 3 Encounters:  11/16/22 192 lb (87.1 kg)  07/09/22 195 lb (88.5 kg)  01/06/22 186 lb 9.6 oz (84.6 kg)     Ht Readings from Last 3 Encounters:  11/16/22 6' (1.829 m)   07/09/22 5\' 11"  (1.803 m)  01/06/22 5\' 11"  (1.803 m)      General: The patient is awake, alert and appears not in acute distress. The patient is well groomed. Head: Normocephalic, atraumatic. Neck is supple. Mallampati 3,  neck circumference:16 inches . Nasal airflow patent.  Retrognathia is present. Facial hair.  Dental status: biological,  Cardiovascular:  Regular rate and cardiac rhythm by pulse,  without distended neck veins. Respiratory: Lungs are clear to auscultation.  Skin:  Without evidence of ankle edema. Trunk: The patient's posture is erect.   NEUROLOGIC EXAM: The patient is awake and alert, oriented to place and time.   Memory subjective described as impaired-      11/16/2022   11:13 AM  Montreal Cognitive Assessment   Visuospatial/ Executive (0/5) 4  Naming (0/3) 3  Attention: Read list of digits (0/2) 2  Attention: Read list of letters (0/1) 0  Attention: Serial 7 subtraction starting at 100 (0/3) 3  Language: Repeat phrase (0/2) 2  Language : Fluency (0/1) 1  Abstraction (0/2) 2  Delayed Recall (0/5) 2  Orientation (0/6) 6  Total 25    Attention span &  concentration ability appears normal.  Speech is fluent,  with dysphonia and aphasia.  Mood and affect are appropriate.   Cranial nerves: no loss of smell or taste reported  Pupils are equal and briskly reactive to light. Funduscopic exam deferred.  Extraocular movements in vertical and horizontal planes were intact and without nystagmus. No Diplopia. Visual fields by finger perimetry are intact. Hearing was impaired-  Facial sensation intact to fine touch.  Facial motor strength is symmetric and tongue and uvula move midline.  Neck ROM : rotation, tilt and flexion extension were normal for age and shoulder shrug was symmetrical.    Motor exam:  Symmetric bulk, tone and ROM.  Resting tremor noted in both hands.  Normal tone with strong biceps  cog- wheeling, symmetrically reduced  grip strength .    Sensory:  Fine touch, pinprick and vibration were absent at both feet-  Proprioception tested in the upper extremities was normal.   Coordination: Rapid alternating movements in the fingers/hands were of reduced  speed.  The Finger-to-nose maneuver was intact without evidence of dysmetria and tremor.   Gait and station: Patient could rise unassisted from a seated position, walked without assistive device.  Stance is of normal width/ base and the patient turned with 3 steps.  Toe and heel walk were deferred.  Deep tendon reflexes: in the upper and lower extremities were not even trace- unable to elicit.  Babinski response was deferred .    ASSESSMENT AND PLAN 67 y.o. year old male  here with: memory and attention deficit in the setting of poor hearing. Hearing aids didn't help enough.      11/16/2022   11:13 AM  Montreal Cognitive Assessment   Visuospatial/ Executive (0/5) 4  Naming (0/3) 3  Attention: Read list of digits (0/2) 2  Attention: Read list of letters (0/1) 0  Attention: Serial 7 subtraction starting at 100 (0/3) 3  Language: Repeat phrase (0/2) 2  Language : Fluency (0/1) 1  Abstraction (0/2) 2  Delayed Recall (0/5) 2  Orientation (0/6) 6  Total 25       1) progressive memory loss that he associated with a hospital stay in 09-2022, sepsis.   Has been having tremor since hospitalization, by his report-. Has cog -wheeling,   resting tremor, dysphonia and reports Dysphagia.  Has reduced facial mimic. Concern about parkinson's diease, lewy body ? His mother had tremors, neuropathy and was diabetic.  I like to try tremor control wt ith carbidopa levodopa tid 25/ 100 mg.   2) sleep has been restful, not broken up, and he gets at least 7 hours each night. Still daytime sleepy and easily falling asleep- snoring .  We will check for OSA because of the atrial fib history. Wife has not reported REM BD.  I like to order a HST at this time, as in-lab studies are too far booked  out.   Tremor and dysphonia, dysphagia - reportedly worsening since sepsis in 2- 2024, with progressive  memory lapses. MRI brain was ordered.   I plan to follow up either personally or through our NP within 3-5 months. We will repeat a MOCA test in 5 months as well.   I would like to thank Cleatis Polka., MD and Cleatis Polka., Md [address] [place],  [place] [address] for allowing me to meet with and to take care of this pleasant patient.   After spending a total time of  45  minutes face to  face and additional time for physical and neurologic examination, review of laboratory studies,  personal review of imaging studies, reports and results of other testing and review of referral information / records as far as provided in visit,   Electronically signed by: Melvyn Novasarmen Chasady Longwell, MD 11/16/2022 11:33 AM  [facility] and [facility] certified by The [employer] of Sleep Medicine and Diplomate of the [employer] of [employer]. Board certified In Neurology through the ABPN, Fellow of the [employer]. Medical Director of [facility].

## 2022-11-16 NOTE — Telephone Encounter (Signed)
[facility] adv NPR sent to GI [phone] 

## 2022-11-16 NOTE — Patient Instructions (Addendum)
Tremor A tremor is trembling or shaking that you cannot control. Most tremors affect the hands or arms. Tremors can also affect the head, vocal cords, face, and other parts of the body. There are many types of tremors. Common types include: Essential tremor. These usually occur in people older than 40. This type of tremor may run in families and can happen in otherwise healthy people. Resting tremor. These occur when the muscles are at rest, such as when your hands are resting in your lap. People with Parkinson's disease often have resting tremors. Postural tremor. These occur when you try to hold a pose, such as keeping your hands outstretched. Kinetic tremor. These occur during purposeful movement, such as trying to touch a finger to your nose. Task-specific tremor. These may occur when you do certain tasks such as writing, speaking, or standing. Psychogenic tremor. These are greatly reduced or go away when you are distracted. These tremors happen due to underlying stress or psychiatric disease. They can happen in people of all ages. Some types of tremors have no known cause. Tremors can also be a symptom of nervous system problems (neurological disorders) that may occur with aging. Some tremors go away with treatment, while others do not. Follow these instructions at home: Lifestyle     If you drink alcohol: Limit how much you have to: 0-1 drink a day for women who are not pregnant. 0-2 drinks a day for men. Know how much alcohol is in a drink. In the [place] one drink equals one 12 oz bottle of beer (355 mL), one 5 oz glass of wine (148 mL), or one 1 oz glass of hard liquor (44 mL). Do not use any products that contain nicotine or tobacco. These products include cigarettes, chewing tobacco, and vaping devices, such as e-cigarettes. If you need help quitting, ask your health care provider. Avoid extreme heat and extreme cold. Limit your caffeine intake, as told by your health care  provider. Try to get 8 hours of sleep each night. Find ways to manage your stress, such as meditation or yoga. General instructions Take over-the-counter and prescription medicines only as told by your health care provider. Keep all follow-up visits. This is important. Contact a health care provider if: You develop a tremor after starting a new medicine. You have a tremor along with other symptoms such as: Numbness. Tingling. Pain. Weakness. Your tremor gets worse. Your tremor interferes with your day-to-day life. Summary A tremor is trembling or shaking that you cannot control. Most tremors affect the hands or arms. Some types of tremors have no known cause. Others may be a symptom of nervous system problems (neurological disorders). Make sure you discuss any tremors you have with your health care provider. This information is not intended to replace advice given to you by your health care provider. Make sure you discuss any questions you have with your health care provider. Document Revised: 05/16/2021 Document Reviewed: 05/16/2021 Elsevier Patient Education  2023 Elsevier Inc.   Carbidopa; Levodopa Extended-Release Tablets What is this medication? CARBIDOPA; LEVODOPA (kar bi DOE pa; lee voe DOE pa) treats the symptoms of RLS, parkinsonism- tremor, Parkinson disease. It works by increasing the amount of dopamine in your brain, a substance which helps manage body movements and coordination. This reduces the symptoms of Parkinson, such as body stiffness and tremors. This medicine may be used for other purposes; ask your health care provider or pharmacist if you have questions. COMMON BRAND NAME(S): SINEMET, SINEMET CR What should I  tell my care team before I take this medication? They need to know if you have any of these conditions: Depression or other mental health conditions Diabetes Glaucoma Heart disease, including history of a heart attack History of irregular  heartbeat Kidney disease Liver disease Lung or breathing disease, such as asthma Narcolepsy Sleep apnea Stomach or intestine problems An unusual or allergic reaction to levodopa, carbidopa, other medications, foods, dyes, or preservatives Pregnant or trying to get pregnant Breastfeeding How should I use this medication? Take this medication by mouth with a glass of water. Follow the directions on the prescription label. Swallow whole. Do not crush or chew. You may cut the tablets in half. Take your doses at regular intervals. Do not take your medication more often than directed. Do not stop taking except on the advice of your care team. Talk to your care team about the use of this medication in children. Special care may be needed. Overdosage: If you think you have taken too much of this medicine contact a poison control center or emergency room at once. NOTE: This medicine is only for you. Do not share this medicine with others. What if I miss a dose? If you miss a dose, take it as soon as you can. If it is almost time for your next dose, take only that dose. Do not take double or extra doses. What may interact with this medication? Do not take this medication with any of the following: MAOIs, such as Marplan, Nardil, and Parnate Reserpine Tetrabenazine This medication may also interact with the following: Alcohol Droperidol Entacapone Iron supplements or multivitamins with iron Isoniazid, INH Linezolid Medications for blood pressure Medications for mental health conditions Medications that help you fall asleep Metoclopramide Papaverine Procarbazine Tedizolid Rasagiline Selegiline Tolcapone This list may not describe all possible interactions. Give your health care provider a list of all the medicines, herbs, non-prescription drugs, or dietary supplements you use. Also tell them if you smoke, drink alcohol, or use illegal drugs. Some items may interact with your  medicine. What should I watch for while using this medication? Visit your care team for regular checks on your progress. Tell your care team if your symptoms do not start to get better or if they get worse. A severe reaction similar to neuroleptic malignant syndrome (NMS) may occur if you reduce the dose of or stop taking this medication too quickly. Symptoms of NMS include high fever, stiff muscles, increased sweating, fast or irregular heartbeat, and confusion. Contact your care team right away if think you have NMS. This medication may affect your coordination, reaction time, or judgment. Do not drive or operate machinery until you know how this medication affects you. Sit up or stand slowly to reduce the risk of dizzy or fainting spells. Drinking alcohol with this medication can increase the risk of these side effects. When taking this medication, you may fall asleep without notice. You may be doing activities, such as driving a car, talking, or eating. You may not feel drowsy before it happens. Contact your care team right away if this happens to you. There have been reports of increased sexual urges or other strong urges, such as gambling while taking this medication. If you experience any of these while taking this medication, you should report this to your care team as soon as possible. You may experience a 'wearing off' effect before it is time to take your next dose of this medication. You may also experience an 'on-off' effect where the  medication seems to stop working for minutes to hours, then suddenly starts working again. Tell your care team if this happens to you. Your dose may need to be adjusted. Eating high protein foods may affect how this medication works. Tell your care team if you change your diet. If you have diabetes, you may get a false-positive result for sugar in your urine. Check with your care team. This medication can make your saliva, sweat, or urine look dark red or black.  This is normal but may stain clothing or fabrics. This medication may cause low levels of vitamin B6 in your body. Make sure that you get enough vitamin B6 while you are taking this medication. Discuss the foods you eat and the vitamins you take with your care team. What side effects may I notice from receiving this medication? Side effects that you should report to your care team as soon as possible: Allergic reactions--skin rash, itching, hives, swelling of the face, lips, tongue, or throat Falling asleep during daily activities Heart rhythm changes--fast or irregular heartbeat, dizziness, feeling faint or lightheaded, chest pain, trouble breathing Low blood pressure--dizziness, feeling faint or lightheaded, blurry vision Mood and behavior changes--anxiety, nervousness, confusion, hallucinations, irritability, hostility, thoughts of suicide or self-harm, worsening mood, feelings of depression New or worsening uncontrolled and repetitive movements of the face, mouth, or upper body Stomach bleeding--bloody or black, tar-like stools, vomiting blood or brown material that looks like coffee grounds Sudden eye pain or change in vision such as blurry vision, seeing halos around lights, vision loss Urges to engage in impulsive behaviors such as gambling, binge eating, sexual activity, or shopping in ways that are unusual for you Side effects that usually do not require medical attention (report to your care team if they continue or are bothersome): Dark red or black saliva, sweat, or urine Dizziness Drowsiness Headache Nausea This list may not describe all possible side effects. Call your doctor for medical advice about side effects. You may report side effects to FDA at 1-800-FDA-1088. Where should I keep my medication? Keep out of the reach of children. Store below 30 degrees C (86 degrees F). Keep container tightly closed. Throw away any unused medication after the expiration date. NOTE: This  sheet is a summary. It may not cover all possible information. If you have questions about this medicine, talk to your doctor, pharmacist, or health care provider.  2023 Elsevier/Gold Standard (2007-09-17 00:00:00)

## 2022-11-26 ENCOUNTER — Telehealth: Payer: Self-pay | Admitting: Neurology

## 2022-11-26 NOTE — Telephone Encounter (Signed)
HTA Pending faxed notes  

## 2022-12-02 NOTE — Telephone Encounter (Signed)
HTA Berkley Harvey: [id number] (exp. 11/26/22 to 02/24/23)

## 2022-12-04 ENCOUNTER — Ambulatory Visit
Admission: RE | Admit: 2022-12-04 | Discharge: 2022-12-04 | Disposition: A | Discharge status: Home / Self Care | Payer: HMO | Source: Ambulatory Visit | Attending: Neurology | Admitting: Neurology

## 2022-12-04 DIAGNOSIS — G309 Alzheimer's disease, unspecified: Secondary | ICD-10-CM | POA: Diagnosis not present

## 2022-12-04 MED ORDER — GADOPICLENOL 0.5 MMOL/ML IV SOLN
7.5000 mL | Freq: Once | INTRAVENOUS | Status: AC | PRN
  Administered 2022-12-04: 7.5 mL via INTRAVENOUS

## 2022-12-08 ENCOUNTER — Encounter: Payer: Self-pay | Admitting: Neurology

## 2022-12-08 NOTE — Telephone Encounter (Signed)
MAILOUT HST HST-auth#[id number] 4/18-7/17/24   Patient is on the scheduled for 12/16/22

## 2022-12-16 ENCOUNTER — Ambulatory Visit (INDEPENDENT_AMBULATORY_CARE_PROVIDER_SITE_OTHER): Payer: HMO | Admitting: Neurology

## 2022-12-16 DIAGNOSIS — A419 Sepsis, unspecified organism: Secondary | ICD-10-CM

## 2022-12-16 DIAGNOSIS — E1142 Type 2 diabetes mellitus with diabetic polyneuropathy: Secondary | ICD-10-CM

## 2022-12-16 DIAGNOSIS — I700 Atherosclerosis of aorta: Secondary | ICD-10-CM | POA: Diagnosis not present

## 2022-12-16 DIAGNOSIS — I4891 Unspecified atrial fibrillation: Secondary | ICD-10-CM

## 2022-12-16 DIAGNOSIS — R490 Dysphonia: Secondary | ICD-10-CM

## 2022-12-16 DIAGNOSIS — E1110 Type 2 diabetes mellitus with ketoacidosis without coma: Secondary | ICD-10-CM

## 2022-12-16 DIAGNOSIS — R259 Unspecified abnormal involuntary movements: Secondary | ICD-10-CM

## 2022-12-16 DIAGNOSIS — R6520 Severe sepsis without septic shock: Secondary | ICD-10-CM | POA: Diagnosis not present

## 2022-12-16 DIAGNOSIS — G4719 Other hypersomnia: Secondary | ICD-10-CM

## 2022-12-23 DIAGNOSIS — M65342 Trigger finger, left ring finger: Secondary | ICD-10-CM | POA: Diagnosis not present

## 2022-12-23 DIAGNOSIS — M1812 Unilateral primary osteoarthritis of first carpometacarpal joint, left hand: Secondary | ICD-10-CM | POA: Diagnosis not present

## 2022-12-23 DIAGNOSIS — M79642 Pain in left hand: Secondary | ICD-10-CM | POA: Diagnosis not present

## 2022-12-23 DIAGNOSIS — M65312 Trigger thumb, left thumb: Secondary | ICD-10-CM | POA: Diagnosis not present

## 2022-12-23 DIAGNOSIS — M65321 Trigger finger, right index finger: Secondary | ICD-10-CM | POA: Diagnosis not present

## 2022-12-23 DIAGNOSIS — M79641 Pain in right hand: Secondary | ICD-10-CM | POA: Diagnosis not present

## 2023-01-06 NOTE — Telephone Encounter (Signed)
LVM for patient in regards on not completing the mailout HST device. Pt was mailed HST on 12/16/2022. Pt was asked to use the device in the next few days, if not, he will be charged the full amount of device ($300)

## 2023-01-12 NOTE — Progress Notes (Signed)
[facility]  Kong Burkeen 67 year old male 02-24-1956   HOME SLEEP TEST REPORT ( Mail out testing by [facility])   STUDY DATA:   01-12-2023   ORDERING CLINICIAN: Melvyn Novas, MD  REFERRING CLINICIAN: Dr Jacinto Halim, MD and Park Meo NP , PCP Dr Clelia Croft    CLINICAL INFORMATION/HISTORY: This 67 year-old male patient presented on 11-16-2022 with rapid ventricular response to new onset  atrial fibrillation. Sleep relevant medical history: In 09-2022 , he was hospitalized for 6 days , dx with atrial fibrillation. Resting Tremor and dysphonia, dysphagia  since admission to hospital , had sepsis 09-2022.    he had a Tonsillectomy in childhood.  He is hard of hearing. He gained weight.  he reportedly sleeps well and wanted to address other (cognitive problems) in the same visit.  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 The patient used to work in shifts( night/ rotating) .    Epworth sleepiness score: 12/24.  FSS at 44/ 63 points    BMI:26 kg/m   Neck Circumference: 16" , high grade Mallampati    FINDINGS:   Total Recording Time (hours, min):   7 h and 44 m     Total Sleep Time (hours, min):   6 h and 46 m              Percent REM (%):  19.7%                                       Respiratory Indices by CMS criteria :   Calculated pAHI (per hour):    7.8/h   ( AASM score was 17.7/h )                    REM pAHI:  3.8/h    ( AASM AHI was 10.5/h)                                           NREM pAHI:   8.8/h - (AASM AHI was 19.5/h) -suspicious for central apnea                            Positional AHI:  supine AHI by AASM was 39.2/h and by CMS criteria AHI was  18.5/h         Snoring reached a mean Volume of  40  dB                                          Oxygen Saturation Statistics:     O2 Saturation Range (%):Between a nadir at 78% and a maximum  saturation at 98%, mean saturation was 93%                                       O2 Saturation (minutes) <89%:  0.5  min      Pulse  Rate Statistics:   Pulse Mean (bpm):     57 bpm            Pulse Range:    between 48 and 88 bpm             IMPRESSION:  This HST confirms the presence of mild - moderate sleep apnea, depending on scoring set by AASM or CMS. What is concerning is set the apnea hypopnea index, also known as AHI, was higher in non-REM sleep than in REM sleep following both criteria and without significant hypoxia. This indicates a possibility of central sleep apnea being present.  In addition , there is bradycardia noted -there was only one isolated 3-minute spell of a slow heart rate, bradycardia, noted and this could have been an artifact.   RECOMMENDATION: Given the patient's cardiac susceptibility for sleep apnea, I would recommend a trial of CPAP with auto titration settings between 5 and 15 cmH2O with 2 cm EPR and with heated humidifier and mask of comfort and choice. If this does not work for the patient , I will solely recommend positional sleep therapy- I strongly recommend to avoid sleeping on the back of supine AHI was much higher than lateral sleep position AHI's.  Please note that sleep apnea in the setting of atrial fib is always best treated with PSAP therapy.    INTERPRETING PHYSICIAN:   Melvyn Novas, MD  [facility] at [facility].

## 2023-01-13 DIAGNOSIS — H2513 Age-related nuclear cataract, bilateral: Secondary | ICD-10-CM | POA: Diagnosis not present

## 2023-01-13 DIAGNOSIS — H524 Presbyopia: Secondary | ICD-10-CM | POA: Diagnosis not present

## 2023-01-13 DIAGNOSIS — E113293 Type 2 diabetes mellitus with mild nonproliferative diabetic retinopathy without macular edema, bilateral: Secondary | ICD-10-CM | POA: Diagnosis not present

## 2023-01-13 DIAGNOSIS — H25041 Posterior subcapsular polar age-related cataract, right eye: Secondary | ICD-10-CM | POA: Diagnosis not present

## 2023-01-24 NOTE — Procedures (Signed)
[facility]  Willie [name] 67 year old male 1955-10-20   HOME SLEEP TEST REPORT ( Mail out testing by [facility])   STUDY DATA:   01-12-2023   ORDERING CLINICIAN: Melvyn Novas, MD  REFERRING CLINICIAN: Dr Jacinto Halim, MD and Park Meo NP , PCP Dr Clelia Croft    CLINICAL INFORMATION/HISTORY: This 67 year-old male patient presented on 11-16-2022 with rapid ventricular response to new onset  atrial fibrillation. Sleep relevant medical history: In 09-2022 , he was hospitalized for 6 days , dx with atrial fibrillation. Resting Tremor and dysphonia, dysphagia  since admission to hospital , had sepsis 09-2022.    he had a Tonsillectomy in childhood.  He is hard of hearing. He gained weight , affecting his DM . He has known Neuropathy, peripheral.  He reportedly sleeps well and wanted to address other (cognitive problems) in the same visit.  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 The patient used to work in shifts( night/ rotating) .    Epworth sleepiness score: 12/24.  FSS at 44/ 63 points    BMI:26 kg/m   Neck Circumference: 16" , high grade Mallampati    FINDINGS:   Total Recording Time (hours, min):   7 h and 44 m     Total Sleep Time (hours, min):   6 h and 46 m              Percent REM (%):  19.7%                                       Respiratory Indices by CMS criteria :   Calculated pAHI (per hour):    7.8/h   ( AASM score was 17.7/h )                    REM pAHI:  3.8/h    ( AASM AHI was 10.5/h)                                           NREM pAHI:   8.8/h - (AASM AHI was 19.5/h) -suspicious for central apnea                            Positional AHI:  supine AHI by AASM was 39.2/h and by CMS criteria AHI was  18.5/h         Snoring reached a mean Volume of  40  dB                                          Oxygen Saturation Statistics:     O2  Saturation Range (%):Between a nadir at 78% and a maximum saturation at 98%, mean saturation was 93%                                       O2 Saturation (minutes) <89%:  0.5  min      Pulse Rate Statistics:   Pulse Mean (bpm):     57 bpm            Pulse Range:    between 48 and 88 bpm             IMPRESSION:  This HST confirms the presence of mild - moderate sleep apnea, depending on scoring set by AASM or CMS. What is concerning is set the apnea hypopnea index, also known as AHI, was higher in non-REM sleep than in REM sleep following both criteria and without significant hypoxia. This indicates a possibility of central sleep apnea being present.  In addition , there is bradycardia noted -there was only one isolated 3-minute spell of a slow heart rate, bradycardia, noted and this could have been an artifact.   RECOMMENDATION: Given the patient's cardiac susceptibility for sleep apnea, I would recommend a trial of CPAP with auto titration settings between 5 and 15 cmH2O with 2 cm EPR and with heated humidifier and mask of comfort and choice. If this does not work for the patient , I will solely recommend positional sleep therapy- I strongly recommend to avoid sleeping on the back of supine AHI was much higher than lateral sleep position AHI's.  Please note that sleep apnea in the setting of atrial fib is always best treated with PSAP therapy.    INTERPRETING PHYSICIAN:   Melvyn Novas, MD  [facility] at [facility].

## 2023-01-25 ENCOUNTER — Telehealth: Payer: Self-pay

## 2023-01-25 NOTE — Telephone Encounter (Signed)
I called patient to discuss. No answer, left a message asking him to call me back. If patient returns the call, please route to POD 3.

## 2023-01-25 NOTE — Telephone Encounter (Signed)
-----   Message from Melvyn Novas, MD sent at 01/24/2023  9:46 PM EDT ----- Unusual NREM dominant sleep apnea form, mild - moderate, suspect for central apnea being present. Should undergo an auto CPAP treatment as this gives additional AHI information. He has no significant hypoxia and likely some medication induced bradycardia.  Please note that sleep apnea in the setting of atrial fib is always best treated with PAP therapy.    PLAN B is to rely solely on positional therapy, avoiding  supine sleep.  Please finish the CPAP orders as spelled out in my recommendation if the patient is willing to undergo at least a 90 day trial

## 2023-02-01 ENCOUNTER — Other Ambulatory Visit: Payer: Self-pay

## 2023-02-01 DIAGNOSIS — G4733 Obstructive sleep apnea (adult) (pediatric): Secondary | ICD-10-CM

## 2023-02-01 DIAGNOSIS — G4719 Other hypersomnia: Secondary | ICD-10-CM

## 2023-02-01 DIAGNOSIS — I4891 Unspecified atrial fibrillation: Secondary | ICD-10-CM

## 2023-02-01 NOTE — Telephone Encounter (Signed)
Zott, Stacy  Ravin Denardo, Abbe Amsterdam, CMA; Zott, Ermalinda Memos, Tammy Got it Thank You         ----- Message ----- From: Bobbye Morton, CMA Sent: 02/01/2023  10:33 AM EDT To: Marlou Porch Zott Subject: Autopap                                        New orders have been placed for the above pt, DOB: 10/07/1955 Thanks

## 2023-02-01 NOTE — Telephone Encounter (Signed)
I called pt. I advised pt that Dr. Vickey Huger reviewed their sleep study results and found that pt has moderate-sever OSA. Dr. Vickey Huger recommends that pt starts PAP therapy. I reviewed PAP compliance expectations with the pt. Pt is agreeable to starting a CPAP. I advised pt that an order will be sent to a DME, [facility], and [facility] will call the pt within about one week after they file with the pt's insurance. [facility] will show the pt how to use the machine, fit for masks, and troubleshoot the CPAP if needed. A follow up appt was made for insurance purposes with [name] 03/24/23 1015. Pt verbalized understanding to arrive 15 minutes early and bring their CPAP. Pt verbalized understanding of results. Pt had no questions at this time but was encouraged to call back if questions arise. I have sent the order to [facility] and have received confirmation that they have received the order.

## 2023-02-03 DIAGNOSIS — M65321 Trigger finger, right index finger: Secondary | ICD-10-CM | POA: Diagnosis not present

## 2023-02-03 DIAGNOSIS — M1812 Unilateral primary osteoarthritis of first carpometacarpal joint, left hand: Secondary | ICD-10-CM | POA: Diagnosis not present

## 2023-02-03 DIAGNOSIS — M65342 Trigger finger, left ring finger: Secondary | ICD-10-CM | POA: Diagnosis not present

## 2023-02-03 DIAGNOSIS — M65312 Trigger thumb, left thumb: Secondary | ICD-10-CM | POA: Diagnosis not present

## 2023-02-09 DIAGNOSIS — E1165 Type 2 diabetes mellitus with hyperglycemia: Secondary | ICD-10-CM | POA: Diagnosis not present

## 2023-02-09 DIAGNOSIS — E782 Mixed hyperlipidemia: Secondary | ICD-10-CM | POA: Diagnosis not present

## 2023-02-09 DIAGNOSIS — I10 Essential (primary) hypertension: Secondary | ICD-10-CM | POA: Diagnosis not present

## 2023-02-09 DIAGNOSIS — Z125 Encounter for screening for malignant neoplasm of prostate: Secondary | ICD-10-CM | POA: Diagnosis not present

## 2023-02-15 DIAGNOSIS — G4733 Obstructive sleep apnea (adult) (pediatric): Secondary | ICD-10-CM | POA: Diagnosis not present

## 2023-02-15 DIAGNOSIS — F5101 Primary insomnia: Secondary | ICD-10-CM | POA: Diagnosis not present

## 2023-02-16 DIAGNOSIS — Z794 Long term (current) use of insulin: Secondary | ICD-10-CM | POA: Diagnosis not present

## 2023-02-16 DIAGNOSIS — Z1339 Encounter for screening examination for other mental health and behavioral disorders: Secondary | ICD-10-CM | POA: Diagnosis not present

## 2023-02-16 DIAGNOSIS — E1139 Type 2 diabetes mellitus with other diabetic ophthalmic complication: Secondary | ICD-10-CM | POA: Diagnosis not present

## 2023-02-16 DIAGNOSIS — I700 Atherosclerosis of aorta: Secondary | ICD-10-CM | POA: Diagnosis not present

## 2023-02-16 DIAGNOSIS — Z8639 Personal history of other endocrine, nutritional and metabolic disease: Secondary | ICD-10-CM | POA: Diagnosis not present

## 2023-02-16 DIAGNOSIS — I480 Paroxysmal atrial fibrillation: Secondary | ICD-10-CM | POA: Diagnosis not present

## 2023-02-16 DIAGNOSIS — D6869 Other thrombophilia: Secondary | ICD-10-CM | POA: Diagnosis not present

## 2023-02-16 DIAGNOSIS — E1142 Type 2 diabetes mellitus with diabetic polyneuropathy: Secondary | ICD-10-CM | POA: Diagnosis not present

## 2023-02-16 DIAGNOSIS — Z1331 Encounter for screening for depression: Secondary | ICD-10-CM | POA: Diagnosis not present

## 2023-02-16 DIAGNOSIS — R82998 Other abnormal findings in urine: Secondary | ICD-10-CM | POA: Diagnosis not present

## 2023-02-16 DIAGNOSIS — F320 Major depressive disorder, single episode, mild: Secondary | ICD-10-CM | POA: Diagnosis not present

## 2023-02-16 DIAGNOSIS — G4733 Obstructive sleep apnea (adult) (pediatric): Secondary | ICD-10-CM | POA: Diagnosis not present

## 2023-02-16 DIAGNOSIS — E1165 Type 2 diabetes mellitus with hyperglycemia: Secondary | ICD-10-CM | POA: Diagnosis not present

## 2023-02-16 DIAGNOSIS — E782 Mixed hyperlipidemia: Secondary | ICD-10-CM | POA: Diagnosis not present

## 2023-02-16 DIAGNOSIS — G3184 Mild cognitive impairment, so stated: Secondary | ICD-10-CM | POA: Diagnosis not present

## 2023-02-16 DIAGNOSIS — I10 Essential (primary) hypertension: Secondary | ICD-10-CM | POA: Diagnosis not present

## 2023-02-16 DIAGNOSIS — Z0000 Encounter for general adult medical examination without abnormal findings: Secondary | ICD-10-CM | POA: Diagnosis not present

## 2023-03-09 ENCOUNTER — Telehealth: Payer: Self-pay

## 2023-03-09 NOTE — Telephone Encounter (Signed)
-----   Message from Glean Salvo sent at 03/09/2023 11:09 AM EDT ----- Regarding: RE: cpap osa appt I could reach out to him to find out what is going on. He was setup 02/15/23, he still has time to meet compliance. We can push it out if needed. Thanks, Sarah ----- Message ----- From: Eather Colas, CMA Sent: 03/09/2023   9:21 AM EDT To: Glean Salvo, NP Subject: cpap osa appt                                  I am not seeing that he is cpap compliant? Shall we keep appt or r/s? Thanks,  Production assistant, radio

## 2023-03-09 NOTE — Telephone Encounter (Signed)
Pt returned call and stated that he has been on a cruise but went 8 days without using since on a cruise. He was instructed to bring machine.

## 2023-03-09 NOTE — Telephone Encounter (Signed)
Lmtcb to see if pt is actually using machine. Upon pre charting , I tried to pull data and it showed he was inactive. We need to see if switched dmes, trouble w/machine, or needs to be card download

## 2023-03-10 DIAGNOSIS — M65342 Trigger finger, left ring finger: Secondary | ICD-10-CM | POA: Diagnosis not present

## 2023-03-18 DIAGNOSIS — G4733 Obstructive sleep apnea (adult) (pediatric): Secondary | ICD-10-CM | POA: Diagnosis not present

## 2023-03-18 DIAGNOSIS — F5101 Primary insomnia: Secondary | ICD-10-CM | POA: Diagnosis not present

## 2023-03-22 DIAGNOSIS — E1169 Type 2 diabetes mellitus with other specified complication: Secondary | ICD-10-CM | POA: Diagnosis not present

## 2023-03-22 DIAGNOSIS — D6869 Other thrombophilia: Secondary | ICD-10-CM | POA: Diagnosis not present

## 2023-03-22 DIAGNOSIS — E1136 Type 2 diabetes mellitus with diabetic cataract: Secondary | ICD-10-CM | POA: Diagnosis not present

## 2023-03-22 DIAGNOSIS — Z794 Long term (current) use of insulin: Secondary | ICD-10-CM | POA: Diagnosis not present

## 2023-03-22 DIAGNOSIS — I700 Atherosclerosis of aorta: Secondary | ICD-10-CM | POA: Diagnosis not present

## 2023-03-22 DIAGNOSIS — I480 Paroxysmal atrial fibrillation: Secondary | ICD-10-CM | POA: Diagnosis not present

## 2023-03-22 DIAGNOSIS — F3342 Major depressive disorder, recurrent, in full remission: Secondary | ICD-10-CM | POA: Diagnosis not present

## 2023-03-22 DIAGNOSIS — E663 Overweight: Secondary | ICD-10-CM | POA: Diagnosis not present

## 2023-03-22 DIAGNOSIS — E11319 Type 2 diabetes mellitus with unspecified diabetic retinopathy without macular edema: Secondary | ICD-10-CM | POA: Diagnosis not present

## 2023-03-22 DIAGNOSIS — E1165 Type 2 diabetes mellitus with hyperglycemia: Secondary | ICD-10-CM | POA: Diagnosis not present

## 2023-03-22 DIAGNOSIS — E1142 Type 2 diabetes mellitus with diabetic polyneuropathy: Secondary | ICD-10-CM | POA: Diagnosis not present

## 2023-03-22 DIAGNOSIS — E785 Hyperlipidemia, unspecified: Secondary | ICD-10-CM | POA: Diagnosis not present

## 2023-03-24 ENCOUNTER — Encounter: Payer: HMO | Admitting: Neurology

## 2023-03-24 DIAGNOSIS — M25642 Stiffness of left hand, not elsewhere classified: Secondary | ICD-10-CM | POA: Diagnosis not present

## 2023-04-07 NOTE — Progress Notes (Unsigned)
[facility] [address] [place]. [place] [address] [phone]       OFFICE FOLLOW UP NOTE  Mr. Willie [name] Date of Birth:  June 05, 1956 Medical Record Number:  [id number]   Reason for visit: Initial CPAP follow-up    SUBJECTIVE:   CHIEF COMPLAINT:  No chief complaint on file.   Brief HPI:   [name] is a 67 y.o. male who was initially evaluated by Dr. Vickey Huger on 11/16/2022 with concerns of possible underlying sleep disorder with complaints of sleepiness and memory loss.  ESS 12/24. FSS 44/63.  MOCA 25/30. Concern of possible Parkinson's disease based on exam and started on carbidopa levodopa 1 tab 3 times daily.  Also recommended completion of MRI brain and sleep study.  Completed HST 12/16/2022 which showed mild to moderate sleep apnea with concern that AHI was higher in non-REM sleep and REM sleep indicating possible central sleep apnea as well as bradycardia.  Recommended initiation of AutoPap which was started on 02/15/2023.  MRI brain 11/2022 showed mild chronic small vessel ischemic disease, right parietal chronic cerebral microhemorrhage, chronic changes have not changed since prior MRI in 2018.   Interval history:              ROS:   14 system review of systems performed and negative with exception of those listed in HPI  PMH:  Past Medical History:  Diagnosis Date   Adenomatous polyp    Atherosclerosis of aorta (HCC)    Cataract    Diabetes mellitus without complication (HCC)    Hearing loss, right    Hyperlipidemia    Hypertension    MRSA infection     PSH:  Past Surgical History:  Procedure Laterality Date   Cervical Lymph Node Skin Biopsy Left    COLONOSCOPY     CYST REMOVAL LEG     DG THUMB LEFT HAND     INCISE AND DRAIN ABCESS     thumb   POLYPECTOMY      Social History:  Social History   Socioeconomic History   Marital status: Married    Spouse name: Abel Zaruba   Number of children: 5   Years of  education: Not on file   Highest education level: Not on file  Occupational History   Not on file  Tobacco Use   Smoking status: Former    Current packs/day: 0.00    Average packs/day: 1 pack/day for 15.0 years (15.0 ttl pk-yrs)    Types: Cigarettes    Start date: 74    Quit date: 4    Years since quitting: 44.6   Smokeless tobacco: Never  Vaping Use   Vaping status: Never Used  Substance and Sexual Activity   Alcohol use: Yes    Comment: occasional wine   Drug use: No   Sexual activity: Not on file  Other Topics Concern   Not on file  Social History Narrative   Not on file   Social Determinants of Health   Financial Resource Strain: Not on file  Food Insecurity: No Food Insecurity (12/14/2019)   Hunger Vital Sign    Worried About Running Out of Food in the Last Year: Never true    Ran Out of Food in the Last Year: Never true  Transportation Needs: Not on file  Physical Activity: Not on file  Stress: Not on file  Social Connections: Not on file  Intimate Partner Violence: Not on file    Family History:  Family History  Problem Relation Age of Onset   Diabetes Mother    Parkinson's disease Mother    Lung cancer Father    Heart attack Father    Breast cancer Sister    CAD Brother    Diabetes Maternal Grandmother    Lung cancer Paternal Grandfather    Colon cancer Neg Hx    Stomach cancer Neg Hx    Colon polyps Neg Hx    Esophageal cancer Neg Hx    Rectal cancer Neg Hx     Medications:   Current Outpatient Medications on File Prior to Visit  Medication Sig Dispense Refill   aspirin 81 MG tablet Take 81 mg by mouth daily.     atorvastatin (LIPITOR) 40 MG tablet Take 1 tablet (40 mg total) by mouth daily. 90 tablet 3   DULoxetine (CYMBALTA) 60 MG capsule Take 60 mg by mouth daily.     ezetimibe (ZETIA) 10 MG tablet TAKE 1 TABLET (10 MG TOTAL) BY MOUTH DAILY AFTER SUPPER. 90 tablet 3   gabapentin (NEURONTIN) 300 MG capsule Take 300 mg by mouth 2 (two)  times daily.     insulin degludec (TRESIBA FLEXTOUCH) 200 UNIT/ML FlexTouch Pen Inject 48 Units into the skin daily.     metFORMIN (GLUCOPHAGE) 1000 MG tablet Take 1,000 mg by mouth 2 (two) times daily with a meal.     metoprolol tartrate (LOPRESSOR) 25 MG tablet Take 0.5 tablets (12.5 mg total) by mouth 2 (two) times daily. 90 tablet 3   pantoprazole (PROTONIX) 40 MG tablet Take 1 tablet by mouth daily.     Semaglutide, 1 MG/DOSE, (OZEMPIC, 1 MG/DOSE,) 4 MG/3ML SOPN Inject 1 mg as directed once a week.     telmisartan (MICARDIS) 40 MG tablet Take 40 mg by mouth daily.     Current Facility-Administered Medications on File Prior to Visit  Medication Dose Route Frequency Provider Last Rate Last Admin   carbidopa-levodopa (SINEMET IR) 25-100 MG per tablet immediate release 1 tablet  1 tablet Oral TID Dohmeier, Porfirio Mylar, MD        Allergies:   Allergies  Allergen Reactions   Empagliflozin     Other Reaction(s): DKA      OBJECTIVE:  Physical Exam  There were no vitals filed for this visit. There is no height or weight on file to calculate BMI. No results found.   General: well developed, well nourished, seated, in no evident distress Head: head normocephalic and atraumatic.   Neck: supple with no carotid or supraclavicular bruits Cardiovascular: regular rate and rhythm, no murmurs Musculoskeletal: no deformity Skin:  no rash/petichiae Vascular:  Normal pulses all extremities   Neurologic Exam Mental Status: Awake and fully alert. Oriented to place and time. Recent and remote memory intact. Attention span, concentration and fund of knowledge appropriate. Mood and affect appropriate.  Cranial Nerves: Pupils equal, briskly reactive to light. Extraocular movements full without nystagmus. Visual fields full to confrontation. Hearing intact. Facial sensation intact. Face, tongue, palate moves normally and symmetrically.  Motor: Normal bulk and tone. Normal strength in all tested  extremity muscles Sensory.: intact to touch , pinprick , position and vibratory sensation.  Coordination: Rapid alternating movements normal in all extremities. Finger-to-nose and heel-to-shin performed accurately bilaterally. Gait and Station: Arises from chair without difficulty. Stance is normal. Gait demonstrates normal stride length and balance without use of AD. Tandem walk and heel toe without difficulty.  Reflexes: 1+ and symmetric. Toes downgoing.      11/16/2022   11:13  AM  Montreal Cognitive Assessment   Visuospatial/ Executive (0/5) 4  Naming (0/3) 3  Attention: Read list of digits (0/2) 2  Attention: Read list of letters (0/1) 0  Attention: Serial 7 subtraction starting at 100 (0/3) 3  Language: Repeat phrase (0/2) 2  Language : Fluency (0/1) 1  Abstraction (0/2) 2  Delayed Recall (0/5) 2  Orientation (0/6) 6  Total 25         ASSESSMENT/PLAN: Kairo Ritter is a 67 y.o. year old male    OSA on CPAP : Compliance report shows satisfactory usage with optimal residual AHI.  Discussed continued nightly usage with ensuring greater than 4 hours nightly for optimal benefit and per insurance purposes.  Continue to follow with DME company for any needed supplies or CPAP related concerns Parkinsonian features: Continue Sinemet 1 tab 3 times daily Memory loss: MOCA today ***/30 (prior 25/30)     Follow up in *** or call earlier if needed   CC:  PCP: Cleatis Polka., MD    I spent *** minutes of face-to-face and non-face-to-face time with patient.  This included previsit chart review, lab review, study review, order entry, electronic health record documentation, patient education regarding diagnosis of sleep apnea with review and discussion of compliance report and answered all other questions to patient's satisfaction   Ihor Austin, [facility]  [facility] [address] [place], [place] [address]  Phone [phone] Fax  [phone] Note: This document was prepared with digital dictation and possible smart phrase technology. Any transcriptional errors that result from this process are unintentional.

## 2023-04-08 ENCOUNTER — Encounter: Payer: Self-pay | Admitting: Adult Health

## 2023-04-08 ENCOUNTER — Telehealth: Payer: Self-pay

## 2023-04-08 ENCOUNTER — Ambulatory Visit: Payer: PPO | Admitting: Adult Health

## 2023-04-08 VITALS — BP 145/68 | HR 85 | Ht 70.0 in | Wt 191.0 lb

## 2023-04-08 DIAGNOSIS — R1312 Dysphagia, oropharyngeal phase: Secondary | ICD-10-CM | POA: Diagnosis not present

## 2023-04-08 DIAGNOSIS — R259 Unspecified abnormal involuntary movements: Secondary | ICD-10-CM

## 2023-04-08 DIAGNOSIS — R490 Dysphonia: Secondary | ICD-10-CM

## 2023-04-08 DIAGNOSIS — G4733 Obstructive sleep apnea (adult) (pediatric): Secondary | ICD-10-CM | POA: Diagnosis not present

## 2023-04-08 DIAGNOSIS — M25642 Stiffness of left hand, not elsewhere classified: Secondary | ICD-10-CM | POA: Diagnosis not present

## 2023-04-08 MED ORDER — CARBIDOPA-LEVODOPA 10-100 MG PO TABS: 0.5000 | ORAL_TABLET | Freq: Three times a day (TID) | ORAL | 11 refills | Status: DC

## 2023-04-08 NOTE — Addendum Note (Signed)
Addended by: Ihor Austin L on: 04/08/2023 02:55 PM   Modules accepted: Orders

## 2023-04-08 NOTE — Patient Instructions (Addendum)
Your Plan:  Recommend starting Sinemet as previously recommended by Dr. Vickey Huger for Parkinson's type features  - recommend taking 1/2 tab three times daily, please call with any difficulty tolerating  Continue nightly use of your CPAP for sleep apnea management, continue to follow with your DME company [facility] for any needed supplies or CPAP related concerns     Follow up with Dr. Vickey Huger in 4 months or call earlier if needed     Thank you for coming to see [place] at [facility]. I hope we have been able to provide you high quality care today.  You may receive a patient satisfaction survey over the next few weeks. We would appreciate your feedback and comments so that we may continue to improve ourselves and the health of our patients.

## 2023-04-18 DIAGNOSIS — F5101 Primary insomnia: Secondary | ICD-10-CM | POA: Diagnosis not present

## 2023-04-18 DIAGNOSIS — G4733 Obstructive sleep apnea (adult) (pediatric): Secondary | ICD-10-CM | POA: Diagnosis not present

## 2023-05-18 DIAGNOSIS — G4733 Obstructive sleep apnea (adult) (pediatric): Secondary | ICD-10-CM | POA: Diagnosis not present

## 2023-05-18 DIAGNOSIS — F5101 Primary insomnia: Secondary | ICD-10-CM | POA: Diagnosis not present

## 2023-06-18 DIAGNOSIS — F5101 Primary insomnia: Secondary | ICD-10-CM | POA: Diagnosis not present

## 2023-06-18 DIAGNOSIS — G4733 Obstructive sleep apnea (adult) (pediatric): Secondary | ICD-10-CM | POA: Diagnosis not present

## 2023-07-07 ENCOUNTER — Ambulatory Visit: Payer: Self-pay | Admitting: Cardiology

## 2023-07-07 NOTE — Progress Notes (Deleted)
Cardiology Office Note:  .   Date:  07/07/2023  ID:  Willie [name], DOB 09-03-1955, MRN [id number] PCP: Willie Polka., MD  [facility] Providers Cardiologist:  None { Click to update primary MD,subspecialty MD or APP then REFRESH:1}  History of Present Illness: Willie [name]   Willie [name] is a 67 y.o. Patient admitted with UTI, ileus and community-acquired pneumonia and severe sepsis on 09/16/2021 to the hospital along with diabetic ketoacidosis. Patient developed transient atrial fibrillation and converted to sinus rhythm within a day and was started on Eliquis and discharged home. Past medical history is significant for hypertension, hyperlipidemia, mellitus, coronary calcification noted on the CT scan of the chest and diabetes mellitus with peripheral neuropathy.  He has not had any recurrence of atrial fibrillation and when he was last evaluated a year ago, patient had discontinued Eliquis. He now presents for routine visit.  Discussed the use of AI scribe software for clinical note transcription with the patient, who gave verbal consent to proceed.  History of Present Illness            ROS  Labs   Lab Results  Component Value Date   NA 134 (L) 09/21/2021   K 3.8 09/21/2021   CO2 21 (L) 09/21/2021   GLUCOSE 132 (H) 09/21/2021   BUN 10 09/21/2021   CREATININE 0.66 09/21/2021   CALCIUM 8.5 (L) 09/21/2021   GFRNONAA >60 09/21/2021      Latest Ref Rng & Units 09/21/2021    5:51 AM 09/20/2021    6:26 AM 09/19/2021    1:40 AM  BMP  Glucose 70 - 99 mg/dL 409  811  914   BUN 8 - 23 mg/dL 10  14  15    Creatinine 0.61 - 1.24 mg/dL 7.82  9.56  2.13   Sodium 135 - 145 mmol/L 134  137  135   Potassium 3.5 - 5.1 mmol/L 3.8  3.4  3.9   Chloride 98 - 111 mmol/L 101  103  103   CO2 22 - 32 mmol/L 21  19  20    Calcium 8.9 - 10.3 mg/dL 8.5  8.6  9.2        Latest Ref Rng & Units 09/21/2021    5:51 AM 09/20/2021    6:26 AM 09/19/2021    5:43 AM  CBC  WBC 4.0 - 10.5 K/uL 12.0   11.0  11.7   Hemoglobin 13.0 - 17.0 g/dL 08.6  57.8  46.9   Hematocrit 39.0 - 52.0 % 37.0  36.5  39.6   Platelets 150 - 400 K/uL 259  249  242     External Labs:  Labs 01/08/2022:  Total cholesterol 98, triglycerides 59, HDL 32, LDL 54.  BUN 12, creatinine 0.66, EGFR 96 mL.  Labs 09/08/2022:  A1c 9.0%.  Physical Exam:   VS:  There were no vitals taken for this visit.   Wt Readings from Last 3 Encounters:  04/08/23 191 lb (86.6 kg)  11/16/22 192 lb (87.1 kg)  07/09/22 195 lb (88.5 kg)     Physical Exam  Studies Reviewed: .    CT chest and abdomen without contrast 09/16/2021: Cardiovascular: The cardiac size is normal. There is no pericardial effusion. There is patchy calcification in the LAD and right coronary arteries. There is aortic calcific atherosclerosis in the arch and distal descending segment.   Zio Patch Extended out patient EKG monitoring 14 days starting 12/03/2021: Predominant rhythm is normal sinus rhythm.  Brief atrial  tachycardia episodes, longest 6 beats.  Isolated PACs, atrial couplets.  Isolated ventricular ectopics and rare ventricular couplets.  Occasional ventricular bigeminy.  22 patient triggered events, normal sinus rhythm.   PCV MYOCARDIAL PERFUSION WO LEXISCAN 10/15/2021 Abnormal ECG stress. The patient exercised for 6 minutes and 0 seconds of a Bruce protocol, achieving approximately 7.42 METs. Resting EKG demonstrated normal sinus rhythm. No ST-T wave abnormalities. Peak EKG revealed greater than 1 mm ST depression of the inferolateral leads. Recurrence of ST changes in recovery after 2 minutes.  No chest pain. Stress terminated due to dyspnea and THR achieved. The blood pressure response was normal. Mild diaphragmatic attenuation noted. Myocardial perfusion is normal. Overall LV systolic function is normal without regional wall motion abnormalities. Stress LV EF: 55%. No previous exam available for comparison. Low risk.   Echocardiogram 09/20/2021:   1. Left ventricular ejection fraction, by estimation, is 55 to 60%. The left ventricle has normal function. The left ventricle has no regional wall motion abnormalities. Left ventricular diastolic parameters are indeterminate.  2. Right ventricular systolic function is normal. The right ventricular size is normal. Tricuspid regurgitation signal is inadequate for assessing PA pressure.  3. The mitral valve is normal in structure. Trivial mitral valve regurgitation. No evidence of mitral stenosis.  4. The aortic valve is tricuspid. Aortic valve regurgitation is not visualized. Aortic valve sclerosis/calcification is present, without any evidence of aortic stenosis.  5. The inferior vena cava is normal in size with greater than 50% respiratory variability, suggesting right atrial pressure of 3 mmHg.  EKG:         EKG 07/09/2022: Sinus bradycardia at rate of 58 bpm. Normal axis. Nonspecific T wave abnormality. Compared to previous EKG on 01/06/2022, no significant change.   Medications and allergies    Allergies  Allergen Reactions   Empagliflozin     Other Reaction(s): DKA     Current Outpatient Medications:    aspirin 81 MG tablet, Take 81 mg by mouth daily., Disp: , Rfl:    atorvastatin (LIPITOR) 40 MG tablet, Take 1 tablet (40 mg total) by mouth daily., Disp: 90 tablet, Rfl: 3   carbidopa-levodopa (SINEMET) 10-100 MG tablet, Take 0.5 tablets by mouth 3 (three) times daily., Disp: 45 tablet, Rfl: 11   doxycycline (VIBRAMYCIN) 100 MG capsule, Take 100 mg by mouth 2 (two) times daily., Disp: , Rfl:    DULoxetine (CYMBALTA) 60 MG capsule, Take 60 mg by mouth daily., Disp: , Rfl:    ezetimibe (ZETIA) 10 MG tablet, TAKE 1 TABLET (10 MG TOTAL) BY MOUTH DAILY AFTER SUPPER., Disp: 90 tablet, Rfl: 3   gabapentin (NEURONTIN) 300 MG capsule, Take 300 mg by mouth 2 (two) times daily., Disp: , Rfl:    insulin degludec (TRESIBA FLEXTOUCH) 200 UNIT/ML FlexTouch Pen, Inject 48 Units into the skin daily.,  Disp: , Rfl:    metFORMIN (GLUCOPHAGE) 1000 MG tablet, Take 1,000 mg by mouth 2 (two) times daily with a meal., Disp: , Rfl:    metoprolol tartrate (LOPRESSOR) 25 MG tablet, Take 0.5 tablets (12.5 mg total) by mouth 2 (two) times daily., Disp: 90 tablet, Rfl: 3   pantoprazole (PROTONIX) 40 MG tablet, Take 1 tablet by mouth daily., Disp: , Rfl:    Semaglutide, 1 MG/DOSE, (OZEMPIC, 1 MG/DOSE,) 4 MG/3ML SOPN, Inject 1 mg as directed once a week., Disp: , Rfl:    telmisartan (MICARDIS) 40 MG tablet, Take 40 mg by mouth daily., Disp: , Rfl:    ASSESSMENT AND PLAN: .  ICD-10-CM   1. History of atrial fibrillation without current medication  Z86.79     2. Primary hypertension  I10     3. Pure hypercholesterolemia  E78.00       1. Primary hypertension ***  2. History of atrial fibrillation without current medication ***  3. Pure hypercholesterolemia ***   Assessment and Plan                {Are you ordering a CV Procedure (e.g. stress test, cath, DCCV, TEE, etc)?   Press F2        :[id number]}   Signed,  Yates Decamp, MD, [facility] 07/07/2023, 6:36 AM [facility] [address] [place], [place] [address] Phone: [phone]. Fax:  [phone]

## 2023-07-09 ENCOUNTER — Ambulatory Visit: Payer: HMO

## 2023-07-18 DIAGNOSIS — F5101 Primary insomnia: Secondary | ICD-10-CM | POA: Diagnosis not present

## 2023-07-18 DIAGNOSIS — G4733 Obstructive sleep apnea (adult) (pediatric): Secondary | ICD-10-CM | POA: Diagnosis not present

## 2023-08-18 DIAGNOSIS — G4733 Obstructive sleep apnea (adult) (pediatric): Secondary | ICD-10-CM | POA: Diagnosis not present

## 2023-08-18 DIAGNOSIS — F5101 Primary insomnia: Secondary | ICD-10-CM | POA: Diagnosis not present

## 2023-08-19 DIAGNOSIS — Z794 Long term (current) use of insulin: Secondary | ICD-10-CM | POA: Diagnosis not present

## 2023-08-19 DIAGNOSIS — G3184 Mild cognitive impairment, so stated: Secondary | ICD-10-CM | POA: Diagnosis not present

## 2023-08-19 DIAGNOSIS — D6869 Other thrombophilia: Secondary | ICD-10-CM | POA: Diagnosis not present

## 2023-08-19 DIAGNOSIS — G20B1 Parkinson's disease with dyskinesia, without mention of fluctuations: Secondary | ICD-10-CM | POA: Diagnosis not present

## 2023-08-19 DIAGNOSIS — F320 Major depressive disorder, single episode, mild: Secondary | ICD-10-CM | POA: Diagnosis not present

## 2023-08-19 DIAGNOSIS — Z23 Encounter for immunization: Secondary | ICD-10-CM | POA: Diagnosis not present

## 2023-08-19 DIAGNOSIS — I700 Atherosclerosis of aorta: Secondary | ICD-10-CM | POA: Diagnosis not present

## 2023-08-19 DIAGNOSIS — E1139 Type 2 diabetes mellitus with other diabetic ophthalmic complication: Secondary | ICD-10-CM | POA: Diagnosis not present

## 2023-08-19 DIAGNOSIS — I10 Essential (primary) hypertension: Secondary | ICD-10-CM | POA: Diagnosis not present

## 2023-08-19 DIAGNOSIS — I480 Paroxysmal atrial fibrillation: Secondary | ICD-10-CM | POA: Diagnosis not present

## 2023-08-19 DIAGNOSIS — Z8639 Personal history of other endocrine, nutritional and metabolic disease: Secondary | ICD-10-CM | POA: Diagnosis not present

## 2023-08-19 DIAGNOSIS — E782 Mixed hyperlipidemia: Secondary | ICD-10-CM | POA: Diagnosis not present

## 2023-08-23 ENCOUNTER — Ambulatory Visit: Payer: PPO | Admitting: Neurology

## 2023-08-23 ENCOUNTER — Encounter: Payer: Self-pay | Admitting: Neurology

## 2023-08-23 VITALS — BP 124/64 | HR 80 | Ht 70.0 in | Wt 187.0 lb

## 2023-08-23 DIAGNOSIS — R251 Tremor, unspecified: Secondary | ICD-10-CM | POA: Insufficient documentation

## 2023-08-23 DIAGNOSIS — I4891 Unspecified atrial fibrillation: Secondary | ICD-10-CM | POA: Diagnosis not present

## 2023-08-23 DIAGNOSIS — G250 Essential tremor: Secondary | ICD-10-CM | POA: Diagnosis not present

## 2023-08-23 DIAGNOSIS — R490 Dysphonia: Secondary | ICD-10-CM | POA: Diagnosis not present

## 2023-08-23 MED ORDER — CARBIDOPA-LEVODOPA 25-100 MG PO TABS: 1.0000 | ORAL_TABLET | Freq: Three times a day (TID) | ORAL | 5 refills | Status: DC

## 2023-08-23 NOTE — Patient Instructions (Signed)
 MID APNEA, can be treated with positional therapy, tennis ball methode.   Atrial fib  Tremor action and rest,   Memory STM impairment.

## 2023-08-23 NOTE — Progress Notes (Signed)
 Provider:  Dedra Gores, MD  Primary Care Physician:  Willie [name]., MD [address] [place] [address]     Referring Provider Dr Ladona, MD        Chief Complaint according to patient   Patient presents with:     New Patient (Initial Visit)           HISTORY OF PRESENT ILLNESS:  Willie [name] is a 68 y.o. male patient who is here for revisit 08/23/2023 for  follow up on memory and  sleep .  Chief concern according to patient :  Here with low CPAP compliance.     This 68 year-old male patient presented on 11-16-2022 with rapid ventricular response to new onset  atrial fibrillation. Sleep relevant medical history: In 09-2022 , he was hospitalized for 6 days , dx with atrial fibrillation. Resting Tremor and dysphonia, dysphagia  since admission to hospital , had sepsis 09-2022.    he had a Tonsillectomy in childhood.  He is hard of hearing. He gained weight.  he reportedly sleeps well and wanted to address other (cognitive problems) in the same visit.  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 The patient used to work in shifts( night/ rotating) .  Calculated pAHI (per hour):    7.8/h   ( AASM score was 17.7/h )                    REM pAHI:  3.8/h    ( AASM AHI was 10.5/h)                                           NREM pAHI:   8.8/h - (AASM AHI was 19.5/h) -suspicious for central apnea                            Positional AHI:  supine AHI by AASM was 39.2/h and by CMS criteria AHI was  18.5/h          Snoring reached a mean Volume of  40  dB                                          Oxygen Saturation Statistics:     O2 Saturation Range (%):Between a nadir at 78% and a maximum saturation at 98%, mean saturation was 93%               Epworth sleepiness score: 12/24.  FSS at 44/ 63 points       Consult: Willie [name] is a 68 y.o. male  patient who is seen upon referral on 11/16/2022 from Dr Willie  for a sleep evaluation in the contact of memory loss.    Chief concern according to patient :  I sleep well, but I have memory problems, and atrial fib. My wife handles a lot of financial affairs, and I help.    I have the pleasure of seeing Willie [name] 11/16/22 a right-handed male with a memory concern, and referred for sleepiness , possible sleep disorder.  Sleep relevant medical history: 09-2022 , hospitalized for 6 days , dx with atrial fibrillation, Tonsillectomy in child hood.  He is hard of hearing. He gained weight.   Family medical /sleep history: no other family member on CPAP with OSA.    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  Pets are not present. Tobacco use: smoked 4 year - 45 years ago-.   ETOH use ; rare ,  Caffeine intake in form of Coffee( yes,  1-2 cups) Soda( 2-3 / day) Tea ( only when eating out) or energy drinks Exercise in form of work related - .   Hobbies :on my feet   Review of Systems: Out of a complete 14 system review, the patient complains of only the following symptoms, and all other reviewed systems are negative.:  Fatigue, sleepiness , snoring,   How likely are you to doze in the following situations: 0 = not likely, 1 = slight chance, 2 = moderate chance, 3 = high chance   Sitting and Reading? Watching Television? Sitting inactive in a public place (theater or meeting)? As a passenger in a car for an hour without a break? Lying down in the afternoon when circumstances permit? Sitting and talking to someone? Sitting quietly after lunch without alcohol ? In a car, while stopped for a few minutes in traffic?   Total = 12/ 24 points   FSS endorsed at 36/ 63 points.    GDS 4/ 15   Social History   Socioeconomic History   Marital status: Married    Spouse name: Willie [name]   Number of children: 5   Years of education: Not on file   Highest education level: Not on file  Occupational History   Not on file  Tobacco Use   Smoking status: Former    Current packs/day: 0.00    Average packs/day: 1 pack/day for 15.0 years (15.0 ttl pk-yrs)    Types: Cigarettes    Start date: 5    Quit date: 62    Years since quitting: 45.0   Smokeless tobacco: Never  Vaping Use   Vaping status: Never Used  Substance and Sexual Activity   Alcohol  use: Yes    Comment: occasional wine   Drug use: No   Sexual activity: Not on file  Other Topics Concern   Not on file  Social History Narrative   Not on file   Social Drivers of Health   Financial Resource Strain: Not on file  Food Insecurity: No Food Insecurity (12/14/2019)   Hunger Vital Sign    Worried About Running Out of Food in the Last Year: Never true    Ran Out of Food in the Last Year: Never true  Transportation Needs: Not on file  Physical Activity: Not on file  Stress: Not on file  Social Connections: Not on file    Family History  Problem Relation Age of Onset   Diabetes Mother    Parkinson's disease Mother    Lung cancer Father    Heart attack Father    Breast cancer Sister    CAD Brother    Diabetes Maternal Grandmother    Lung cancer Paternal Grandfather    Colon cancer Neg Hx    Stomach  cancer Neg Hx    Colon polyps Neg Hx    Esophageal cancer Neg Hx    Rectal cancer Neg Hx     Past Medical History:  Diagnosis Date   Adenomatous polyp    Atherosclerosis of aorta (HCC)    Cataract    Diabetes mellitus without complication (HCC)    Hearing loss, right    Hyperlipidemia    Hypertension    MRSA infection     Past Surgical History:  Procedure Laterality Date   Cervical Lymph Node Skin Biopsy Left    COLONOSCOPY     CYST REMOVAL LEG     DG THUMB LEFT HAND      INCISE AND DRAIN ABCESS     thumb   POLYPECTOMY       Current Outpatient Medications on File Prior to Visit  Medication Sig Dispense Refill   aspirin  81 MG tablet Take 81 mg by mouth daily.     atorvastatin  (LIPITOR) 40 MG tablet Take 1 tablet (40 mg total) by mouth daily. 90 tablet 3   carbidopa -levodopa  (SINEMET ) 10-100 MG tablet Take 0.5 tablets by mouth 3 (three) times daily. 45 tablet 11   doxycycline (VIBRAMYCIN) 100 MG capsule Take 100 mg by mouth 2 (two) times daily.     DULoxetine  (CYMBALTA ) 60 MG capsule Take 60 mg by mouth daily.     ezetimibe  (ZETIA ) 10 MG tablet TAKE 1 TABLET (10 MG TOTAL) BY MOUTH DAILY AFTER SUPPER. 90 tablet 3   gabapentin  (NEURONTIN ) 300 MG capsule Take 300 mg by mouth 2 (two) times daily.     insulin  degludec (TRESIBA  FLEXTOUCH) 200 UNIT/ML FlexTouch Pen Inject 48 Units into the skin daily.     metFORMIN (GLUCOPHAGE) 1000 MG tablet Take 1,000 mg by mouth 2 (two) times daily with a meal.     pantoprazole  (PROTONIX ) 40 MG tablet Take 1 tablet by mouth daily.     Semaglutide, 1 MG/DOSE, (OZEMPIC, 1 MG/DOSE,) 4 MG/3ML SOPN Inject 1 mg as directed once a week.     telmisartan (MICARDIS) 40 MG tablet Take 40 mg by mouth daily.     metoprolol  tartrate (LOPRESSOR ) 25 MG tablet Take 0.5 tablets (12.5 mg total) by mouth 2 (two) times daily. 90 tablet 3   No current facility-administered medications on file prior to visit.    Allergies  Allergen Reactions   Empagliflozin     Other Reaction(s): DKA     DIAGNOSTIC DATA (LABS, IMAGING, TESTING) - I reviewed patient records, labs, notes, testing and imaging myself where available.  Lab Results  Component Value Date   WBC 12.0 (H) 09/21/2021   HGB 12.9 (L) 09/21/2021   HCT 37.0 (L) 09/21/2021   MCV 87.5 09/21/2021   PLT 259 09/21/2021      Component Value Date/Time   NA 134 (L) 09/21/2021 0551   K 3.8 09/21/2021 0551   CL 101 09/21/2021 0551   CO2 21 (L) 09/21/2021 0551   GLUCOSE 132 (H) 09/21/2021  0551   BUN 10 09/21/2021 0551   CREATININE 0.66 09/21/2021 0551   CALCIUM  8.5 (L) 09/21/2021 0551   PROT 6.7 09/17/2021 0105   ALBUMIN 2.9 (L) 09/17/2021 0105   AST 14 (L) 09/17/2021 0105   ALT 13 09/17/2021 0105   ALKPHOS 95 09/17/2021 0105   BILITOT 1.6 (H) 09/17/2021 0105   GFRNONAA >60 09/21/2021 0551   No results found for: CHOL, HDL, LDLCALC, LDLDIRECT, TRIG, CHOLHDL Lab Results  Component Value Date   HGBA1C  7.7 (H) 09/17/2021   No results found for: VITAMINB12 Lab Results  Component Value Date   TSH 0.451 09/17/2021    PHYSICAL EXAM:  Today's Vitals   08/23/23 1330  BP: 124/64  Pulse: 80  Weight: 187 lb (84.8 kg)  Height: 5' 10 (1.778 m)   Body mass index is 26.83 kg/m.   Wt Readings from Last 3 Encounters:  08/23/23 187 lb (84.8 kg)  04/08/23 191 lb (86.6 kg)  11/16/22 192 lb (87.1 kg)     Ht Readings from Last 3 Encounters:  08/23/23 5' 10 (1.778 m)  04/08/23 5' 10 (1.778 m)  11/16/22 6' (1.829 m)      General: The patient is awake, alert and appears not in acute distress. The patient is well groomed. Head: Normocephalic, atraumatic. Neck is supple.   Mallampati 3,  neck circumference:16 inches . Nasal airflow patent.  Retrognathia is present. Facial hair.  Dental status: biological,  Cardiovascular:  Regular rate and cardiac rhythm by pulse,  without distended neck veins. Respiratory: Lungs are clear to auscultation.  Skin:  Without evidence of ankle edema. Trunk: The patient's posture is erect.  Attention span & concentration ability appears normal.  Speech is fluent,  with dysphonia and aphasia.  Mood and affect are appropriate.   Cranial nerves: no loss of smell or taste reported  Pupils are equal and briskly reactive to light. Funduscopic exam deferred.  Extraocular movements in vertical and horizontal planes were intact and without nystagmus. No Diplopia. Visual fields by finger perimetry are intact. Hearing was  impaired-  Facial sensation intact to fine touch.  Facial motor strength is symmetric and tongue and uvula move midline.  Neck ROM : rotation, tilt and flexion extension were normal for age and shoulder shrug was symmetrical.    Motor exam:  Symmetric bulk, tone and ROM.  Resting tremor noted in both hands.  Normal tone with strong biceps cog- wheeling, wrist rigor with extension and flexion- symmetrically reduced  grip strength .   Sensory:  Fine touch, pinprick and vibration were absent at both feet-  Proprioception tested in the upper extremities was normal.   Coordination: Rapid alternating movements in the fingers/hands were of reduced  speed.  The Finger-to-nose maneuver was intact without evidence of dysmetria and tremor.   Gait and station: Patient could rise unassisted from a seated position, walked without assistive device.    ASSESSMENT AND PLAN 68 y.o. year old male  here with:  Sleep apnea, positional and  atrial fibrillation and memory problems,  Given the patient's cardiac susceptibility for sleep apnea, I would recommend a trial of CPAP with auto titration settings between 5 and 15 cmH2O with 2 cm EPR and with heated humidifier and mask of comfort and choice. If this does not work for the patient , I will solely recommend positional sleep therapy- I strongly recommend to avoid sleeping on the back of supine AHI was much higher than lateral sleep position AHI's.   Please note that sleep apnea in the setting of atrial fib is always best treated with PSAP therapy.    1) OSA but using CPAP, didn't bring machine, didn't know type of mask ( nasal cradle) . He reports its sliding up / off at night.  He didn't feel better when using it so he stopped.   2) compliance was 13 % for last 90 days, he stopped using it in October. AHI was 2.2 /h on 5-15 cm water pressure.  Tremor and Memory  have not improved but not declined.   3) restart with a different mask and if still no benefit,  lets go solely with positional therapy  Follow up through our NP within 6 months. MOCA at that time.   4) no dream enactment.-continue your carbidopa /  levodopa  for bilateral tremor.   I would like to thank Willie [name]., MD and Willie [name]., Md [address] [place],  [place] [address] for allowing me to meet with and to take care of this pleasant patient.   After spending a total time of  35  minutes face to face and additional time for physical and neurologic examination, review of laboratory studies,  personal review of imaging studies, reports and results of other testing and review of referral information / records as far as provided in visit,   Electronically signed by: Dedra Gores, MD 08/23/2023 2:03 PM  [facility] Board certified by The [employer] of Sleep Medicine and Diplomate of the [employer] of Sleep Medicine. Board certified In Neurology through the ABPN, Fellow of the [employer] of Neu[employer].

## 2023-09-18 DIAGNOSIS — F5101 Primary insomnia: Secondary | ICD-10-CM | POA: Diagnosis not present

## 2023-09-18 DIAGNOSIS — G4733 Obstructive sleep apnea (adult) (pediatric): Secondary | ICD-10-CM | POA: Diagnosis not present

## 2023-10-16 DIAGNOSIS — G4733 Obstructive sleep apnea (adult) (pediatric): Secondary | ICD-10-CM | POA: Diagnosis not present

## 2023-10-16 DIAGNOSIS — F5101 Primary insomnia: Secondary | ICD-10-CM | POA: Diagnosis not present

## 2023-10-21 ENCOUNTER — Ambulatory Visit: Payer: Self-pay | Attending: Cardiology | Admitting: Cardiology

## 2023-10-21 ENCOUNTER — Encounter: Payer: Self-pay | Admitting: Cardiology

## 2023-10-21 VITALS — BP 124/68 | HR 54 | Resp 16 | Ht 70.0 in | Wt 184.4 lb

## 2023-10-21 DIAGNOSIS — Z794 Long term (current) use of insulin: Secondary | ICD-10-CM | POA: Diagnosis not present

## 2023-10-21 DIAGNOSIS — I2510 Atherosclerotic heart disease of native coronary artery without angina pectoris: Secondary | ICD-10-CM | POA: Diagnosis not present

## 2023-10-21 DIAGNOSIS — E7800 Pure hypercholesterolemia, unspecified: Secondary | ICD-10-CM

## 2023-10-21 DIAGNOSIS — I10 Essential (primary) hypertension: Secondary | ICD-10-CM

## 2023-10-21 DIAGNOSIS — E1165 Type 2 diabetes mellitus with hyperglycemia: Secondary | ICD-10-CM

## 2023-10-21 NOTE — Progress Notes (Signed)
 Cardiology Office Note:  .   Date:  10/21/2023  ID:  Willie [name], DOB [birth year], MRN [id number] PCP: Cleatis Polka., MD  [facility] Providers Cardiologist:  Yates Decamp, MD   History of Present Illness: Willie [name]   Willie [name] is a 68 y.o. Patient with paroxysmal atrial fibrillation and he developed UTI, ACS, community-acquired severe sepsis on 09/16/2021, spontaneously converted to sinus rhythm in view of high CHA2DS2-VASc score, started on Eliquis and discharged home.  However after an event monitor for 2 weeks on 01/06/2022 revealing no evidence of atrial fibrillation, patient discontinued anticoagulation.  He has had a low risk nuclear stress test in March 2023 had a essentially normal echocardiogram.  He has a new diagnosis of Parkinson's disease.  Past medical history is significant for hypertension, hyperlipidemia, diabetes mellitus with peripheral neuropathy.  Discussed the use of AI scribe software for clinical note transcription with the patient, who gave verbal consent to proceed.  History of Present Illness   The patient, with a history of Parkinson's disease, diabetes, and high cholesterol, presents for a routine follow-up. The patient's spouse reports that the patient has been diagnosed with Parkinson's disease and is currently on carbidopa-levodopa, which has helped some of his symptoms. The patient's diabetes is not well controlled, with a recent A1c of 8.2%. The patient is on Ozempic for diabetes management, and his insulin dosage has been recently adjusted. The patient's cholesterol levels are high, with an LDL of 150, despite being on atorvastatin and Zetia. The patient also has a history of AFib but has not had any recent episodes. The patient reports occasional "rushes" or skipped heartbeats, which occur more often when he is at rest. The patient denies any chest pain, shortness of breath, or heart racing symptoms.      Labs   External Labs:  [facility] labs from PCP  02/09/2023:  Total cholesterol 219, triglycerides 173, HDL 39, LDL 145.  TSH normal at 3.360.  Labs 08/19/2023:  A1c 8.8%.  Review of Systems  Cardiovascular:  Positive for palpitations (occasional skipped beats). Negative for chest pain, dyspnea on exertion and leg swelling.   Physical Exam:   VS:  BP 124/68 (BP Location: Left Arm, Patient Position: Sitting, Cuff Size: Normal)   Pulse (!) 54   Resp 16   Ht 5\' 10"  (1.778 m)   Wt 184 lb 6.4 oz (83.6 kg)   SpO2 94%   BMI 26.46 kg/m    Wt Readings from Last 3 Encounters:  10/21/23 184 lb 6.4 oz (83.6 kg)  08/23/23 187 lb (84.8 kg)  04/08/23 191 lb (86.6 kg)     Physical Exam Neck:     Vascular: No carotid bruit or JVD.  Cardiovascular:     Rate and Rhythm: Normal rate and regular rhythm.     Pulses: Intact distal pulses.     Heart sounds: Normal heart sounds. No murmur heard.    No gallop.  Pulmonary:     Effort: Pulmonary effort is normal.     Breath sounds: Normal breath sounds.  Abdominal:     General: Bowel sounds are normal.     Palpations: Abdomen is soft.  Musculoskeletal:     Right lower leg: No edema.     Left lower leg: No edema.    Studies Reviewed: Willie [name]    Exercise nuclear stress test 10/15/2021: Abnormal ECG stress. The patient exercised for 6 minutes and 0 seconds of a Bruce protocol, achieving approximately 7.42 METs. Resting EKG demonstrated  normal sinus rhythm. No ST-T wave abnormalities. Peak EKG revealed greater than 1 mm ST depression of the inferolateral leads. Recurrence of ST changes in recovery after 2 minutes.  No chest pain. Stress terminated due to dyspnea and THR achieved. The blood pressure response was normal. Mild diaphragmatic attenuation noted. Myocardial perfusion is normal. Overall LV systolic function is normal without regional wall motion abnormalities. Stress LV EF: 55%. No previous exam available for comparison. Low risk.   Echocardiogram 09/20/2021:  1. Left ventricular ejection  fraction, by estimation, is 55 to 60%. The left ventricle has normal function. The left ventricle has no regional wall motion abnormalities. Left ventricular diastolic parameters are indeterminate.  2. Right ventricular systolic function is normal. The right ventricular size is normal. Tricuspid regurgitation signal is inadequate for assessing PA pressure.  3. The mitral valve is normal in structure. Trivial mitral valve regurgitation. No evidence of mitral stenosis.  4. The aortic valve is tricuspid. Aortic valve regurgitation is not visualized. Aortic valve sclerosis/calcification is present, without any evidence of aortic stenosis.  5. The inferior vena cava is normal in size with greater than 50% respiratory variability, suggesting right atrial pressure of 3 mmHg. EKG:    EKG Interpretation Date/Time:  Thursday October 21 2023 10:11:53 EDT Ventricular Rate:  69 PR Interval:  164 QRS Duration:  96 QT Interval:  406 QTC Calculation: 435 R Axis:   0  Text Interpretation: EKG 10/21/2023: Normal sinus rhythm at rate of 69 bpm, normal EKG.  No significant change from 09/19/2021. Confirmed by Delrae Rend [phone]) on 10/21/2023 10:51:51 AM    Medications and allergies    Allergies  Allergen Reactions   Empagliflozin     Other Reaction(s): DKA     Current Outpatient Medications:    aspirin 81 MG tablet, Take 81 mg by mouth daily., Disp: , Rfl:    atorvastatin (LIPITOR) 40 MG tablet, Take 1 tablet (40 mg total) by mouth daily., Disp: 90 tablet, Rfl: 3   carbidopa-levodopa (SINEMET IR) 25-100 MG tablet, Take 1 tablet by mouth 3 (three) times daily., Disp: 90 tablet, Rfl: 5   DULoxetine (CYMBALTA) 60 MG capsule, Take 60 mg by mouth daily., Disp: , Rfl:    ezetimibe (ZETIA) 10 MG tablet, TAKE 1 TABLET (10 MG TOTAL) BY MOUTH DAILY AFTER SUPPER., Disp: 90 tablet, Rfl: 3   gabapentin (NEURONTIN) 300 MG capsule, Take 300 mg by mouth 2 (two) times daily., Disp: , Rfl:    insulin degludec (TRESIBA  FLEXTOUCH) 200 UNIT/ML FlexTouch Pen, Inject 54 Units into the skin daily., Disp: , Rfl:    metFORMIN (GLUCOPHAGE) 1000 MG tablet, Take 1,000 mg by mouth 2 (two) times daily with a meal., Disp: , Rfl:    Semaglutide, 1 MG/DOSE, (OZEMPIC, 1 MG/DOSE,) 4 MG/3ML SOPN, Inject 1 mg as directed once a week., Disp: , Rfl:    telmisartan (MICARDIS) 40 MG tablet, Take 40 mg by mouth daily., Disp: , Rfl:    metoprolol tartrate (LOPRESSOR) 25 MG tablet, Take 0.5 tablets (12.5 mg total) by mouth 2 (two) times daily. (Patient not taking: Reported on 10/21/2023), Disp: 90 tablet, Rfl: 3   ASSESSMENT AND PLAN: .      ICD-10-CM   1. Primary hypertension  I10     2. Pure hypercholesterolemia  E78.00     3. Coronary artery calcification seen on CAT scan  I25.10 EKG 12-Lead    4. Type 2 diabetes mellitus with hyperglycemia, with long-term current use of insulin (HCC)  E11.65    Z79.4      Assessment and Plan    Coronary artery disease with calcification   Mild plaque buildup is present in the coronary arteries as shown on a CT scan. LDL cholesterol is elevated at 150 mg/dL, exceeding the target of less than 70 mg/dL for coronary artery disease. He is currently on atorvastatin 40 mg and Zetia 10 mg daily. Blood pressure is well controlled at 124/68 mmHg on telmisartan 40 mg daily. Heart function is normal by echocardiogram, and EKG shows normal sinus rhythm with no significant changes from previous tests. A normal stress test indicates no significant blockages. It is crucial to achieve the LDL goal to prevent disease progression.   If LDL is not controlled, atorvastatin may need to be increased to 80 mg. Continue atorvastatin 40 mg, Zetia 10 mg, telmisartan 40 mg, and aspirin 81 mg daily. Recheck blood cholesterol levels and consider increasing atorvastatin if necessary.  Type 2 Diabetes Mellitus   Diabetes is not well controlled with a hemoglobin A1c of 8.8%. He is on Ozempic with recent changes in insulin  dosage. Managing diabetes is essential to prevent further complications, especially given the coronary artery disease. An appointment with the PCP or a nurse director/PA for diabetes management is recommended. Emphasize the importance of taking prescribed medications and encourage regular physical activity to help control diabetes.  Parkinson's disease (suspected)   He has started on carbidopa/levodopa for suspected Parkinson's disease, which has helped some symptoms. No major testing has been done to confirm the diagnosis. Continue carbidopa/levodopa.  Follow-up   He is well-controlled from a cardiac standpoint but needs to address diabetes management and cholesterol control. An upcoming appointment with the PCP is scheduled for April 15th. He is advised to contact the cardiologist if experiencing shortness of breath or heart racing symptoms. Follow up with the PCP on April 15th for diabetes and cholesterol management.          Signed,  Yates Decamp, MD, [facility] 10/21/2023, 11:04 AM [facility] [address] [place], [place] [address] Phone: [phone]. Fax:  [phone]

## 2023-10-21 NOTE — Patient Instructions (Signed)

## 2023-10-27 DIAGNOSIS — D692 Other nonthrombocytopenic purpura: Secondary | ICD-10-CM | POA: Diagnosis not present

## 2023-10-27 DIAGNOSIS — L0109 Other impetigo: Secondary | ICD-10-CM | POA: Diagnosis not present

## 2023-10-27 DIAGNOSIS — L0889 Other specified local infections of the skin and subcutaneous tissue: Secondary | ICD-10-CM | POA: Diagnosis not present

## 2023-10-27 DIAGNOSIS — L821 Other seborrheic keratosis: Secondary | ICD-10-CM | POA: Diagnosis not present

## 2023-10-27 DIAGNOSIS — D0439 Carcinoma in situ of skin of other parts of face: Secondary | ICD-10-CM | POA: Diagnosis not present

## 2023-10-27 DIAGNOSIS — D485 Neoplasm of uncertain behavior of skin: Secondary | ICD-10-CM | POA: Diagnosis not present

## 2023-11-16 DIAGNOSIS — F5101 Primary insomnia: Secondary | ICD-10-CM | POA: Diagnosis not present

## 2023-11-16 DIAGNOSIS — G4733 Obstructive sleep apnea (adult) (pediatric): Secondary | ICD-10-CM | POA: Diagnosis not present

## 2023-11-25 DIAGNOSIS — I10 Essential (primary) hypertension: Secondary | ICD-10-CM | POA: Diagnosis not present

## 2023-11-25 DIAGNOSIS — G4733 Obstructive sleep apnea (adult) (pediatric): Secondary | ICD-10-CM | POA: Diagnosis not present

## 2023-11-25 DIAGNOSIS — Z794 Long term (current) use of insulin: Secondary | ICD-10-CM | POA: Diagnosis not present

## 2023-11-25 DIAGNOSIS — E1139 Type 2 diabetes mellitus with other diabetic ophthalmic complication: Secondary | ICD-10-CM | POA: Diagnosis not present

## 2023-11-25 DIAGNOSIS — E782 Mixed hyperlipidemia: Secondary | ICD-10-CM | POA: Diagnosis not present

## 2023-12-15 ENCOUNTER — Encounter: Payer: Self-pay | Admitting: Neurology

## 2024-01-16 DIAGNOSIS — G4733 Obstructive sleep apnea (adult) (pediatric): Secondary | ICD-10-CM | POA: Diagnosis not present

## 2024-01-16 DIAGNOSIS — F5101 Primary insomnia: Secondary | ICD-10-CM | POA: Diagnosis not present

## 2024-01-26 DIAGNOSIS — H25041 Posterior subcapsular polar age-related cataract, right eye: Secondary | ICD-10-CM | POA: Diagnosis not present

## 2024-01-26 DIAGNOSIS — H02834 Dermatochalasis of left upper eyelid: Secondary | ICD-10-CM | POA: Diagnosis not present

## 2024-01-26 DIAGNOSIS — H2513 Age-related nuclear cataract, bilateral: Secondary | ICD-10-CM | POA: Diagnosis not present

## 2024-01-26 DIAGNOSIS — H02831 Dermatochalasis of right upper eyelid: Secondary | ICD-10-CM | POA: Diagnosis not present

## 2024-01-26 DIAGNOSIS — E113293 Type 2 diabetes mellitus with mild nonproliferative diabetic retinopathy without macular edema, bilateral: Secondary | ICD-10-CM | POA: Diagnosis not present

## 2024-01-26 DIAGNOSIS — H52203 Unspecified astigmatism, bilateral: Secondary | ICD-10-CM | POA: Diagnosis not present

## 2024-01-30 DIAGNOSIS — L03113 Cellulitis of right upper limb: Secondary | ICD-10-CM | POA: Diagnosis not present

## 2024-02-15 DIAGNOSIS — F5101 Primary insomnia: Secondary | ICD-10-CM | POA: Diagnosis not present

## 2024-02-15 DIAGNOSIS — G4733 Obstructive sleep apnea (adult) (pediatric): Secondary | ICD-10-CM | POA: Diagnosis not present

## 2024-02-21 NOTE — Progress Notes (Unsigned)
 [facility] [address] [place]. [place] [address] [phone]       OFFICE FOLLOW UP NOTE  Mr. [name] Date of Birth:  10-03-1955 Medical Record Number:  [id number]   Reason for visit: Initial CPAP follow-up    SUBJECTIVE:   CHIEF COMPLAINT:  No chief complaint on file.   Brief HPI:   [name] is a 68 y.o. male who was initially evaluated by Dr. Chalice on 11/16/2022 with concerns of possible underlying sleep disorder with complaints of sleepiness and memory loss.  ESS 12/24. FSS 44/63.  MOCA 25/30.  Also complained of tremor, dysphonia and dysphagia worsening since sepsis in 09/2022 with progressive memory lapses.  Concern of possible Parkinson's disease based on exam and reported symptoms and started on carbidopa  levodopa  1 tab 3 times daily.  Also recommended completion of MRI brain and sleep study.  Completed HST 12/16/2022 which showed mild to moderate sleep apnea with concern that AHI was higher in non-REM sleep and REM sleep indicating possible central sleep apnea as well as bradycardia.  Recommended initiation of AutoPap which was started on 02/15/2023.  MRI brain 11/2022 showed mild chronic small vessel ischemic disease, right parietal chronic cerebral microhemorrhage, chronic changes have not changed since prior MRI in 2018.   Interval history:  Patient returns for follow-up visit.  Previously seen by Dr. Chalice 08/23/2023 and noted poor CPAP compliance due to difficulty tolerating mask, recommended change of mask and if still no benefit, recommended positional therapy.  Recommended continuation of Sinemet  therapy for bilateral tremor although denied much improvement.  He did retry CPAP from May-June ***     Memory about the same since prior visit, greater difficulty with short term   He did not start Sinemet  after prior visit as order placed as clinically administered medication, he does not recall being told to start this medication  previously.  He continues to have upper extremity tremors, worsened some since prior visit but can fluctuate in severity. Has not noticed tremor at rest. Can have jaw tremor if he tries to match up his upper and lower teeth.  Denies lower extremity tremor. He does have some gait impairment with imbalance, ambulates without AD, no recent falls.  Dysphagia still present, denies worsening but unsure if improved.  Greater difficulty if he takes too big of a bite or with harder foods, tolerates thin liquids without difficulty.          ROS:   14 system review of systems performed and negative with exception of those listed in HPI  PMH:  Past Medical History:  Diagnosis Date   Adenomatous polyp    Atherosclerosis of aorta (HCC)    Cataract    Diabetes mellitus without complication (HCC)    Hearing loss, right    Hyperlipidemia    Hypertension    MRSA infection     PSH:  Past Surgical History:  Procedure Laterality Date   Cervical Lymph Node Skin Biopsy Left    COLONOSCOPY     CYST REMOVAL LEG     DG THUMB LEFT HAND     INCISE AND DRAIN ABCESS     thumb   POLYPECTOMY      Social History:  Social History   Socioeconomic History   Marital status: Married    Spouse name: Isaish Alemu   Number of children: 5   Years of education: Not on file   Highest education level: Not on file  Occupational History   Not on  file  Tobacco Use   Smoking status: Former    Current packs/day: 0.00    Average packs/day: 1 pack/day for 15.0 years (15.0 ttl pk-yrs)    Types: Cigarettes    Start date: 33    Quit date: 64    Years since quitting: 45.5   Smokeless tobacco: Never  Vaping Use   Vaping status: Never Used  Substance and Sexual Activity   Alcohol  use: Yes    Comment: occasional wine   Drug use: No   Sexual activity: Not on file  Other Topics Concern   Not on file  Social History Narrative   Not on file   Social Drivers of Health   Financial Resource Strain:  Not on file  Food Insecurity: No Food Insecurity (12/14/2019)   Hunger Vital Sign    Worried About Running Out of Food in the Last Year: Never true    Ran Out of Food in the Last Year: Never true  Transportation Needs: Not on file  Physical Activity: Not on file  Stress: Not on file  Social Connections: Not on file  Intimate Partner Violence: Not on file    Family History:  Family History  Problem Relation Age of Onset   Diabetes Mother    Parkinson's disease Mother    Lung cancer Father    Heart attack Father    Breast cancer Sister    CAD Brother    Diabetes Maternal Grandmother    Lung cancer Paternal Grandfather    Colon cancer Neg Hx    Stomach cancer Neg Hx    Colon polyps Neg Hx    Esophageal cancer Neg Hx    Rectal cancer Neg Hx     Medications:   Current Outpatient Medications on File Prior to Visit  Medication Sig Dispense Refill   aspirin  81 MG tablet Take 81 mg by mouth daily.     atorvastatin  (LIPITOR) 40 MG tablet Take 1 tablet (40 mg total) by mouth daily. 90 tablet 3   carbidopa -levodopa  (SINEMET  IR) 25-100 MG tablet Take 1 tablet by mouth 3 (three) times daily. 90 tablet 5   DULoxetine  (CYMBALTA ) 60 MG capsule Take 60 mg by mouth daily.     ezetimibe  (ZETIA ) 10 MG tablet TAKE 1 TABLET (10 MG TOTAL) BY MOUTH DAILY AFTER SUPPER. 90 tablet 3   gabapentin  (NEURONTIN ) 300 MG capsule Take 300 mg by mouth 2 (two) times daily.     insulin  degludec (TRESIBA  FLEXTOUCH) 200 UNIT/ML FlexTouch Pen Inject 54 Units into the skin daily.     metFORMIN (GLUCOPHAGE) 1000 MG tablet Take 1,000 mg by mouth 2 (two) times daily with a meal.     metoprolol  tartrate (LOPRESSOR ) 25 MG tablet Take 0.5 tablets (12.5 mg total) by mouth 2 (two) times daily. (Patient not taking: Reported on 10/21/2023) 90 tablet 3   Semaglutide, 1 MG/DOSE, (OZEMPIC, 1 MG/DOSE,) 4 MG/3ML SOPN Inject 1 mg as directed once a week.     telmisartan (MICARDIS) 40 MG tablet Take 40 mg by mouth daily.     No  current facility-administered medications on file prior to visit.    Allergies:   Allergies  Allergen Reactions   Empagliflozin     Other Reaction(s): DKA      OBJECTIVE:  Physical Exam  There were no vitals filed for this visit.  There is no height or weight on file to calculate BMI. No results found.  General: well developed, well nourished, very pleasant Caucasian male, seated,  in no evident distress Head: head normocephalic and atraumatic.   Neck: supple with no carotid or supraclavicular bruits Cardiovascular: regular rate and rhythm, no murmurs Musculoskeletal: no deformity Skin:  no rash/petichiae Vascular:  Normal pulses all extremities   Neurologic Exam Mental Status: Awake and fully alert.  Some speech hesitancy with dysphonia.  Oriented to place and time. Recent and remote memory intact. Attention span, concentration and fund of knowledge appropriate. Mood and affect appropriate.  Mild facial masking noted. Cranial Nerves: Pupils equal, briskly reactive to light. Extraocular movements full without nystagmus. Visual fields full to confrontation. Hearing intact. Facial sensation intact. Face, tongue, palate moves normally and symmetrically.  Motor: Normal strength in all tested extremity muscles except slightly decreased handgrip strength bilaterally.  Increased tone in upper extremities.  Mild L>RUE postural and intention tremor.  Mild cogwheel rigidity LUE. Unable to appreciate resting tremor on exam today.  Coordination: Rapid alternating movements slightly impaired upper and lower extremity. Finger-to-nose and heel-to-shin performed accurately bilaterally. Gait and Station: Arises from chair without difficulty. Stance is normal. Gait demonstrates normal stride length and mild imbalance without use of AD. Decreased RUE arm swing.   Reflexes: 1+ and symmetric. Toes downgoing.       11/16/2022   11:13 AM  Montreal Cognitive Assessment   Visuospatial/ Executive (0/5)  4  Naming (0/3) 3  Attention: Read list of digits (0/2) 2  Attention: Read list of letters (0/1) 0  Attention: Serial 7 subtraction starting at 100 (0/3) 3  Language: Repeat phrase (0/2) 2  Language : Fluency (0/1) 1  Abstraction (0/2) 2  Delayed Recall (0/5) 2  Orientation (0/6) 6  Total 25         ASSESSMENT/PLAN: Willie [name] is a 68 y.o. year old male    OSA on CPAP : Compliance report shows satisfactory usage with optimal residual AHI.  Continue current pressure settings.  Discussed continued nightly usage with ensuring greater than 4 hours nightly for optimal benefit and per insurance purposes.  Continue to follow with DME company for any needed supplies or CPAP related concerns ?  Parkinsonism: tremor, dysphagia, dysphonia and gait impairment. Recommend trialing Sinemet  as Dr. Chalice previously recommended -recommend starting at 0.5 tabs 3 times daily.  Discussed potential side effects and patient will call with any difficulty tolerating. Short-term memory loss: reports stable since prior visit     Follow up in 4 months with Dr. Chalice for follow-up regarding possible parkinsonism or call earlier if needed   CC:  PCP: Loreli Elsie JONETTA Mickey., MD    I spent 40 minutes of face-to-face and non-face-to-face time with patient.  This included previsit chart review, lab review, study review, order entry, electronic health record documentation, patient education regarding diagnosis of sleep apnea with review and discussion of compliance report and answered all other questions to patient's satisfaction   Harlene Bogaert, [facility]  [facility] [address] [place], [place] [address]  Phone [phone] Fax [phone] Note: This document was prepared with digital dictation and possible smart phrase technology. Any transcriptional errors that result from this process are unintentional.

## 2024-02-22 ENCOUNTER — Ambulatory Visit: Payer: PPO | Admitting: Adult Health

## 2024-02-22 ENCOUNTER — Encounter: Payer: Self-pay | Admitting: Adult Health

## 2024-02-22 VITALS — BP 121/68 | HR 74 | Ht 71.0 in | Wt 187.0 lb

## 2024-02-22 DIAGNOSIS — G20C Parkinsonism, unspecified: Secondary | ICD-10-CM | POA: Diagnosis not present

## 2024-02-22 DIAGNOSIS — G4733 Obstructive sleep apnea (adult) (pediatric): Secondary | ICD-10-CM | POA: Diagnosis not present

## 2024-02-22 DIAGNOSIS — Z789 Other specified health status: Secondary | ICD-10-CM

## 2024-02-22 DIAGNOSIS — G3184 Mild cognitive impairment, so stated: Secondary | ICD-10-CM

## 2024-02-22 MED ORDER — CARBIDOPA-LEVODOPA 25-100 MG PO TABS: 1.0000 | ORAL_TABLET | Freq: Three times a day (TID) | ORAL | 3 refills | Status: AC

## 2024-02-22 NOTE — Patient Instructions (Addendum)
 Your Plan:  Continue Sinemet  1 tab 3 times daily -Ensure you are taking consistently 3 times a day, first thing in the morning, mid afternoon and early evening. Avoid taking with meals to ensure adequate absorption.   Please call with need of dosage increase prior to follow-up visit or if you would like to pursue a DaTscan  Try use of nasal mask with CPAP. If you tolerate this mask better, please let me know so I can place an updated order to your DME company. If you continue to have difficulty tolerating, can proceed with positional therapy as recommended by Dr. Chalice such as avoiding laying on your back    Follow up with Dr. Chalice in 4 to 6 months or call earlier if needed     Thank you for coming to see us  at [facility]. I hope we have been able to provide you high quality care today.  You may receive a patient satisfaction survey over the next few weeks. We would appreciate your feedback and comments so that we may continue to improve ourselves and the health of our patients.

## 2024-03-08 DIAGNOSIS — E782 Mixed hyperlipidemia: Secondary | ICD-10-CM | POA: Diagnosis not present

## 2024-03-08 DIAGNOSIS — I10 Essential (primary) hypertension: Secondary | ICD-10-CM | POA: Diagnosis not present

## 2024-03-08 DIAGNOSIS — E1142 Type 2 diabetes mellitus with diabetic polyneuropathy: Secondary | ICD-10-CM | POA: Diagnosis not present

## 2024-03-08 DIAGNOSIS — I480 Paroxysmal atrial fibrillation: Secondary | ICD-10-CM | POA: Diagnosis not present

## 2024-03-08 DIAGNOSIS — Z125 Encounter for screening for malignant neoplasm of prostate: Secondary | ICD-10-CM | POA: Diagnosis not present

## 2024-03-08 DIAGNOSIS — E1139 Type 2 diabetes mellitus with other diabetic ophthalmic complication: Secondary | ICD-10-CM | POA: Diagnosis not present

## 2024-03-08 DIAGNOSIS — Z794 Long term (current) use of insulin: Secondary | ICD-10-CM | POA: Diagnosis not present

## 2024-03-15 DIAGNOSIS — Z794 Long term (current) use of insulin: Secondary | ICD-10-CM | POA: Diagnosis not present

## 2024-03-15 DIAGNOSIS — G729 Myopathy, unspecified: Secondary | ICD-10-CM | POA: Diagnosis not present

## 2024-03-15 DIAGNOSIS — F320 Major depressive disorder, single episode, mild: Secondary | ICD-10-CM | POA: Diagnosis not present

## 2024-03-15 DIAGNOSIS — I10 Essential (primary) hypertension: Secondary | ICD-10-CM | POA: Diagnosis not present

## 2024-03-15 DIAGNOSIS — Z1389 Encounter for screening for other disorder: Secondary | ICD-10-CM | POA: Diagnosis not present

## 2024-03-15 DIAGNOSIS — R82998 Other abnormal findings in urine: Secondary | ICD-10-CM | POA: Diagnosis not present

## 2024-03-15 DIAGNOSIS — E1139 Type 2 diabetes mellitus with other diabetic ophthalmic complication: Secondary | ICD-10-CM | POA: Diagnosis not present

## 2024-03-15 DIAGNOSIS — G629 Polyneuropathy, unspecified: Secondary | ICD-10-CM | POA: Diagnosis not present

## 2024-03-15 DIAGNOSIS — E1142 Type 2 diabetes mellitus with diabetic polyneuropathy: Secondary | ICD-10-CM | POA: Diagnosis not present

## 2024-03-15 DIAGNOSIS — Z1331 Encounter for screening for depression: Secondary | ICD-10-CM | POA: Diagnosis not present

## 2024-03-15 DIAGNOSIS — G20B1 Parkinson's disease with dyskinesia, without mention of fluctuations: Secondary | ICD-10-CM | POA: Diagnosis not present

## 2024-03-15 DIAGNOSIS — G4733 Obstructive sleep apnea (adult) (pediatric): Secondary | ICD-10-CM | POA: Diagnosis not present

## 2024-03-15 DIAGNOSIS — Z1339 Encounter for screening examination for other mental health and behavioral disorders: Secondary | ICD-10-CM | POA: Diagnosis not present

## 2024-03-15 DIAGNOSIS — I700 Atherosclerosis of aorta: Secondary | ICD-10-CM | POA: Diagnosis not present

## 2024-03-15 DIAGNOSIS — G3184 Mild cognitive impairment, so stated: Secondary | ICD-10-CM | POA: Diagnosis not present

## 2024-03-15 DIAGNOSIS — G609 Hereditary and idiopathic neuropathy, unspecified: Secondary | ICD-10-CM | POA: Diagnosis not present

## 2024-03-15 DIAGNOSIS — E782 Mixed hyperlipidemia: Secondary | ICD-10-CM | POA: Diagnosis not present

## 2024-03-15 DIAGNOSIS — Z860101 Personal history of adenomatous and serrated colon polyps: Secondary | ICD-10-CM | POA: Diagnosis not present

## 2024-03-15 DIAGNOSIS — I480 Paroxysmal atrial fibrillation: Secondary | ICD-10-CM | POA: Diagnosis not present

## 2024-03-15 DIAGNOSIS — Z0000 Encounter for general adult medical examination without abnormal findings: Secondary | ICD-10-CM | POA: Diagnosis not present

## 2024-03-15 DIAGNOSIS — Z8639 Personal history of other endocrine, nutritional and metabolic disease: Secondary | ICD-10-CM | POA: Diagnosis not present

## 2024-05-29 DIAGNOSIS — M542 Cervicalgia: Secondary | ICD-10-CM | POA: Diagnosis not present

## 2024-06-21 DIAGNOSIS — E782 Mixed hyperlipidemia: Secondary | ICD-10-CM | POA: Diagnosis not present

## 2024-06-21 DIAGNOSIS — Z23 Encounter for immunization: Secondary | ICD-10-CM | POA: Diagnosis not present

## 2024-06-21 DIAGNOSIS — E1139 Type 2 diabetes mellitus with other diabetic ophthalmic complication: Secondary | ICD-10-CM | POA: Diagnosis not present

## 2024-06-21 DIAGNOSIS — Z794 Long term (current) use of insulin: Secondary | ICD-10-CM | POA: Diagnosis not present

## 2024-06-21 DIAGNOSIS — I10 Essential (primary) hypertension: Secondary | ICD-10-CM | POA: Diagnosis not present

## 2024-06-21 DIAGNOSIS — G4733 Obstructive sleep apnea (adult) (pediatric): Secondary | ICD-10-CM | POA: Diagnosis not present

## 2024-06-26 ENCOUNTER — Ambulatory Visit: Admitting: Neurology

## 2024-06-26 ENCOUNTER — Encounter: Payer: Self-pay | Admitting: Neurology

## 2024-06-26 VITALS — BP 138/80 | HR 75 | Ht 72.0 in | Wt 186.4 lb

## 2024-06-26 DIAGNOSIS — H9313 Tinnitus, bilateral: Secondary | ICD-10-CM

## 2024-06-26 DIAGNOSIS — E1142 Type 2 diabetes mellitus with diabetic polyneuropathy: Secondary | ICD-10-CM | POA: Diagnosis not present

## 2024-06-26 DIAGNOSIS — R490 Dysphonia: Secondary | ICD-10-CM

## 2024-06-26 DIAGNOSIS — G250 Essential tremor: Secondary | ICD-10-CM

## 2024-06-26 DIAGNOSIS — R259 Unspecified abnormal involuntary movements: Secondary | ICD-10-CM

## 2024-06-26 DIAGNOSIS — Z7984 Long term (current) use of oral hypoglycemic drugs: Secondary | ICD-10-CM | POA: Diagnosis not present

## 2024-06-26 DIAGNOSIS — R251 Tremor, unspecified: Secondary | ICD-10-CM

## 2024-06-26 MED ORDER — ALPHA-LIPOIC ACID 100 MG PO CAPS: ORAL_CAPSULE | ORAL | 5 refills | Status: AC

## 2024-06-26 NOTE — Progress Notes (Signed)
 Provider:  Dedra Gores, MD  Primary Care Physician:  Loreli Elsie JONETTA Mickey., MD [address] [place] [address]     Referring Provider: Loreli Elsie JONETTA Mickey., Md [address] [place],  [place] [address]          Chief Complaint according to patient   Patient presents with:                HISTORY OF PRESENT ILLNESS:  Willie [name] is a 68 y.o. male patient who is here for revisit 06/26/2024 for  not using CPAP - he reports excellent sleep when not on CPAP and has not ever slept well with PAP.   He discontinued its use.  Last tried interface was a nasal cradle. He has facial hair, small lower jaw.  H still works 25 hours a week.        Chief concern according to patient :   I stopped using CPAP and feel better       Willie [name] is a 68 y.o. male who was initially evaluated by Dr. Gores on 11/16/2022 with concerns of possible underlying sleep disorder with complaints of sleepiness and memory loss.  ESS 12/24. FSS 44/63.  MOCA 25/30.  Also complained of tremor, dysphonia and dysphagia worsening since sepsis in 09/2022 with progressive memory lapses.  Concern of possible Parkinson's disease based on exam and reported symptoms and started on carbidopa  levodopa  1 tab 3 times daily.  Also recommended completion of MRI brain and sleep study.   Completed HST 12/16/2022 which showed mild to moderate sleep apnea with concern that AHI was higher in non-REM sleep and REM sleep indicating possible central sleep apnea as well as bradycardia.  Recommended initiation of AutoPap which was started on 02/15/2023.   MRI brain 11/2022 showed mild chronic small vessel ischemic disease, right parietal chronic cerebral microhemorrhage, chronic changes have not changed since prior MRI in 2018.   At follow-up visit in 03/2023, compliance report showed excellent usage and optimal residual AHI although denied any significant benefit.  He did not start on Sinemet  and recommended starting  half tab 3 times daily for complaints of upper extremity tremors gradually worsening, jaw tremor, gait impairment with imbalance and dysphagia.     Interval history:   Patient returns for follow-up visit accompanied by his wife.  Previously seen by Dr. Gores 08/23/2023 and noted poor CPAP compliance due to difficulty tolerating mask, recommended change of mask and if still no benefit or difficulty tolerating, recommended positional therapy.  Recommended continuation of Sinemet  with increased to 1 tab 3 times daily.    He did retry CPAP from May-June but continued to have difficulty tolerating mask despite trialing AirFit F30 mask and feels he sleeps better without it.  His wife uses a nasal mask and he plans on trying that type of mask. He does endorse being a mouth breather. Did discuss possibility of needing to use chinstrap if he does better with nasal mask.   Cognition has not necessarily changed much since the prior visit.  Continues to struggle with short-term memory difficulties. MOCA today 23, previously 25/30.    Remains on Sinemet  1 tab three times daily but does frequently forget afternoon dose.  He has been consistent with morning and evening dose usually taking upon first awakening and at bedtime.  Denies side effects.  Tremors have been stable  Review of Systems: Out of a complete 14 system review, the patient complains  of only the following symptoms, and all other reviewed systems are negative.:    My brain races all the time and I feel less energy   SLEEPINESS ?  How likely are you to doze in the following situations: 0 = not likely, 1 = slight chance, 2 = moderate chance, 3 = high chance  Sitting and Reading? Watching Television? Sitting inactive in a public place (theater or meeting)? Lying down in the afternoon when circumstances permit? Sitting and talking to someone? Sitting quietly after lunch without alcohol ? In a car, while stopped for a few minutes in  traffic? As a passenger in a car for an hour without a break?  Total =  5  GDS 2/   FSS 34/ 63    Neuropathic pain, stinging burning, feet.     Social History   Socioeconomic History   Marital status: Married    Spouse name: Albertus Chiarelli   Number of children: 5   Years of education: Not on file   Highest education level: Not on file  Occupational History   Not on file  Tobacco Use   Smoking status: Former    Current packs/day: 0.00    Average packs/day: 1 pack/day for 15.0 years (15.0 ttl pk-yrs)    Types: Cigarettes    Start date: 26    Quit date: 45    Years since quitting: 45.9   Smokeless tobacco: Never  Vaping Use   Vaping status: Never Used  Substance and Sexual Activity   Alcohol  use: Yes    Comment: occasional wine   Drug use: No   Sexual activity: Not on file  Other Topics Concern   Not on file  Social History Narrative   1 cup of coffee daily, 2-3 sodas    Social Drivers of Corporate Investment Banker Strain: Not on file  Food Insecurity: No Food Insecurity (12/14/2019)   Hunger Vital Sign    Worried About Running Out of Food in the Last Year: Never true    Ran Out of Food in the Last Year: Never true  Transportation Needs: Not on file  Physical Activity: Not on file  Stress: Not on file  Social Connections: Not on file    Family History  Problem Relation Age of Onset   Diabetes Mother    Parkinson's disease Mother    Lung cancer Father    Heart attack Father    Breast cancer Sister    CAD Brother    Diabetes Maternal Grandmother    Lung cancer Paternal Grandfather    Colon cancer Neg Hx    Stomach cancer Neg Hx    Colon polyps Neg Hx    Esophageal cancer Neg Hx    Rectal cancer Neg Hx    Sleep apnea Neg Hx    Seizures Neg Hx    Stroke Neg Hx     Past Medical History:  Diagnosis Date   Adenomatous polyp    Atherosclerosis of aorta    Cataract    Diabetes mellitus without complication (HCC)    Hearing loss, right     Hyperlipidemia    Hypertension    MRSA infection     Past Surgical History:  Procedure Laterality Date   Cervical Lymph Node Skin Biopsy Left    COLONOSCOPY     CYST REMOVAL LEG     DG THUMB LEFT HAND     INCISE AND DRAIN ABCESS     thumb   POLYPECTOMY  Current Outpatient Medications on File Prior to Visit  Medication Sig Dispense Refill   aspirin  81 MG tablet Take 81 mg by mouth daily.     atorvastatin  (LIPITOR) 40 MG tablet Take 1 tablet (40 mg total) by mouth daily. 90 tablet 3   carbidopa -levodopa  (SINEMET  IR) 25-100 MG tablet Take 1 tablet by mouth 3 (three) times daily. 270 tablet 3   ezetimibe  (ZETIA ) 10 MG tablet TAKE 1 TABLET (10 MG TOTAL) BY MOUTH DAILY AFTER SUPPER. 90 tablet 3   gabapentin  (NEURONTIN ) 300 MG capsule Take 300 mg by mouth 2 (two) times daily.     insulin  degludec (TRESIBA  FLEXTOUCH) 200 UNIT/ML FlexTouch Pen Inject 54 Units into the skin daily.     metFORMIN (GLUCOPHAGE) 1000 MG tablet Take 1,000 mg by mouth 2 (two) times daily with a meal.     Semaglutide, 1 MG/DOSE, (OZEMPIC, 1 MG/DOSE,) 4 MG/3ML SOPN Inject 1 mg as directed once a week.     telmisartan (MICARDIS) 40 MG tablet Take 40 mg by mouth daily.     DULoxetine  (CYMBALTA ) 60 MG capsule Take 60 mg by mouth daily. (Patient not taking: Reported on 06/26/2024)     metoprolol  tartrate (LOPRESSOR ) 25 MG tablet Take 0.5 tablets (12.5 mg total) by mouth 2 (two) times daily. (Patient not taking: Reported on 06/26/2024) 90 tablet 3   No current facility-administered medications on file prior to visit.    Allergies  Allergen Reactions   Empagliflozin     Other Reaction(s): DKA    MRI brain (with and without) demonstrating: -Mild chronic small vessel ischemic disease. -Single right parietal chronic cerebral microhemorrhage versus dystrophic mineralization. -Previously noted pituitary bright spot has reduced in size compared to prior study from 2018.   - No acute findings.       INTERPRETING  PHYSICIAN:  EDUARD FABIENE HANLON, MD  DIAGNOSTIC DATA (LABS, IMAGING, TESTING) - I reviewed patient records, labs, notes, testing and imaging myself where available.  Lab Results  Component Value Date   WBC 12.0 (H) 09/21/2021   HGB 12.9 (L) 09/21/2021   HCT 37.0 (L) 09/21/2021   MCV 87.5 09/21/2021   PLT 259 09/21/2021      Component Value Date/Time   NA 134 (L) 09/21/2021 0551   K 3.8 09/21/2021 0551   CL 101 09/21/2021 0551   CO2 21 (L) 09/21/2021 0551   GLUCOSE 132 (H) 09/21/2021 0551   BUN 10 09/21/2021 0551   CREATININE 0.66 09/21/2021 0551   CALCIUM  8.5 (L) 09/21/2021 0551   PROT 6.7 09/17/2021 0105   ALBUMIN 2.9 (L) 09/17/2021 0105   AST 14 (L) 09/17/2021 0105   ALT 13 09/17/2021 0105   ALKPHOS 95 09/17/2021 0105   BILITOT 1.6 (H) 09/17/2021 0105   GFRNONAA >60 09/21/2021 0551   No results found for: CHOL, HDL, LDLCALC, LDLDIRECT, TRIG, CHOLHDL Lab Results  Component Value Date   HGBA1C 7.7 (H) 09/17/2021   No results found for: VITAMINB12 Lab Results  Component Value Date   TSH 0.451 09/17/2021    PHYSICAL EXAM:  Vitals:   06/26/24 1335  BP: 138/80  Pulse: 75  SpO2: 97%   No data found. Body mass index is 25.28 kg/m.   Wt Readings from Last 3 Encounters:  06/26/24 186 lb 6.4 oz (84.6 kg)  02/22/24 187 lb (84.8 kg)  10/21/23 184 lb 6.4 oz (83.6 kg)     Ht Readings from Last 3 Encounters:  06/26/24 6' (1.829 m)  02/22/24 5' 11 (1.803  m)  10/21/23 5' 10 (1.778 m)      General: The patient is awake, alert and appears not in acute distress. The patient is well groomed. Head: Normocephalic, atraumatic. Neck is supple.   Mallampati 3,  neck circumference:16 inches . Nasal airflow patent.  Retrognathia is present. Facial hair.  Dental status: biological,  Cardiovascular:  Regular rate and cardiac rhythm by pulse,  without distended neck veins. Respiratory: Lungs are clear to auscultation.  Skin:  Without evidence of ankle  edema. Trunk: The patient's posture is erect.   Attention span & concentration ability appears normal.  Speech is fluent, but  with dysphonia , not  aphasia.  Mood and affect are appropriate.   Cranial nerves: no loss of smell or taste reported  Pupils are equal and briskly reactive to light. Funduscopic exam deferred.  Extraocular movements in vertical and horizontal planes were intact and without nystagmus. No Diplopia. Visual fields by finger perimetry are intact. Hearing was impaired-  to soft voice   Facial sensation intact to fine touch.  Facial motor strength is symmetric and tongue and uvula move midline.  Neck ROM : rotation, tilt and flexion extension were normal for age and shoulder shrug was symmetrical.    Motor exam:  Symmetric bulk, tone and ROM.   Resting tremor noted in both hands.   There is strong biceps cog- wheeling  right over left , wrist rigor  on the right with extension and flexion- but bilateral equally reduced  grip strength .   Sensory:  Fine touch, pinprick and vibration were absent at both feet-  Proprioception tested in the upper extremities was normal.   Coordination: Rapid alternating movements in the fingers/hands were of reduced  speed.  The Finger-to-nose maneuver t with evidence of tremor: low amplitude at rest but mostly with action.    Gait and station: Patient could rise unassisted from a seated position, walked without assistive device.    ASSESSMENT AND PLAN :   68 y.o. year old male  here with:    1) Mild , very mild obstructive sleep apnea,  we can forgo any further  CPAP intervention.  Patient may have benefit from sleep in non-supine position.   D/C CPAP   2)history of atrial  fibrillation, but here with regular pulse today.   3) Tremor  with action. Positive cogwheel rigidity  The right wrist and  biceps are most prominently affected.  Dysphonia but no masked face.   Not stooped, no hip flexion weakness. No foot drop.  Mother had  PD.   Plan DAT scan - to finally rule in or out PD.  Will hold sinemet   on the morning of the DAT scan.  He reports he takes the medication only 2 times daily  unclear if he feels any effect.  If DAT scan returns as abnormal - will consider [facility] JINNY Brinda Ettie in [place].    4) Neuropathy on Gabapentin , but the medication is sedating.  Alpha lipoic fatty acid recommended and added to med lo ist today .   Rv in 6-8 months with NP . Memory testing upon RV by [facility].     I would like to thank Loreli Elsie JONETTA Mickey., MD  for allowing me to meet with this pleasant patient.   Sleep Clinic Patients are generally offered input on sleep hygiene, life style changes and how to improve compliance with medical treatment where applicable. Review and reiteration of good sleep hygiene measures is offered to any  sleep clinic patient, be it in the first consultation or with any follow up visits.    Any patient with sleepiness should be cautioned not to drive, work at heights, or operate dangerous or heavy equipment when feeling tired or sleepy.      The patient will be seen in follow-up in the sleep clinic at [facility] for discussion of test results, sleep related symptoms and treatment compliance review, further management strategies, etc.   The referring provider will be notified of the test results.   The patient's condition requires frequent monitoring and adjustments in the treatment plan, reflecting the ongoing complexity of care.  This provider is the continuing focal point for all needed services for this condition.  After spending a total time of  35  minutes face to face and time for  history taking, physical and neurologic examination, review of laboratory studies,  personal review of imaging studies, reports and results of other testing and review of referral information / records as far as provided in visit,   Electronically signed by: Dedra Gores, MD 06/26/2024 2:07 PM  [facility] Board certified by The [employer] of Sleep Medicine and Diplomate of the [employer] of [employer] Medicine. Board certified In Neurology through the ABPN, Fellow of the [employer] of Neu[employer].

## 2024-07-13 ENCOUNTER — Encounter (HOSPITAL_COMMUNITY): Admission: RE | Admit: 2024-07-13 | Discharge: 2024-07-13 | Attending: Neurology | Admitting: Neurology

## 2024-07-13 ENCOUNTER — Encounter (HOSPITAL_COMMUNITY): Payer: Self-pay

## 2024-07-13 ENCOUNTER — Encounter (HOSPITAL_COMMUNITY)
Admission: RE | Admit: 2024-07-13 | Discharge: 2024-07-13 | Disposition: A | Source: Ambulatory Visit | Attending: Neurology | Admitting: Neurology

## 2024-07-13 DIAGNOSIS — E1142 Type 2 diabetes mellitus with diabetic polyneuropathy: Secondary | ICD-10-CM | POA: Diagnosis not present

## 2024-07-13 DIAGNOSIS — R490 Dysphonia: Secondary | ICD-10-CM | POA: Diagnosis not present

## 2024-07-13 DIAGNOSIS — R259 Unspecified abnormal involuntary movements: Secondary | ICD-10-CM

## 2024-07-13 DIAGNOSIS — H9313 Tinnitus, bilateral: Secondary | ICD-10-CM

## 2024-07-13 DIAGNOSIS — R251 Tremor, unspecified: Secondary | ICD-10-CM

## 2024-07-13 DIAGNOSIS — G250 Essential tremor: Secondary | ICD-10-CM | POA: Diagnosis not present

## 2024-07-13 MED ORDER — IOFLUPANE I 123 185 MBQ/2.5ML IV SOLN
5.0000 | Freq: Once | INTRAVENOUS | Status: AC
  Administered 2024-07-13: 4.9 via INTRAVENOUS
  Filled 2024-07-13: qty 5

## 2024-07-13 MED ORDER — POTASSIUM IODIDE (ANTIDOTE) 130 MG PO TABS
ORAL_TABLET | ORAL | Status: AC
  Filled 2024-07-13: qty 1

## 2024-07-17 ENCOUNTER — Ambulatory Visit: Payer: Self-pay | Admitting: Neurology

## 2024-07-18 NOTE — Telephone Encounter (Signed)
 Relayed to pt that DATSCAN  was (negative) study for PD.  He verbalized understanding.

## 2024-07-18 NOTE — Telephone Encounter (Signed)
 Spoke to wife not on [place] . Wife states will have patient call our office back tomorrow

## 2024-07-18 NOTE — Telephone Encounter (Signed)
-----   Message from [place] Dohmeier sent at 07/17/2024  1:42 PM EST ----- Negative DAT scan for PD ( parkinson's disease)  ----- Message ----- From: Interface, Rad Results In Sent: 07/17/2024   9:35 AM EST To: Dedra Gores, MD

## 2024-07-19 NOTE — Telephone Encounter (Signed)
 Pt called to request a call back  from nurse pt stated he spoke with Nurse about Dat Scan  and  had a few more question .Willie [name]

## 2024-07-31 NOTE — Telephone Encounter (Signed)
 Called pt back but his wife answered. She is not on D[place]. She understands and will have him call us  when he can. I told her I was returning his call because he had a question for us . She thanked me for the call.

## 2024-07-31 NOTE — Telephone Encounter (Signed)
 Pt returned call and his questions were answered about the DatScan . He was appreciative.
